# Patient Record
Sex: Female | Born: 1970 | Race: White | Hispanic: No | Marital: Married | Smoking: Light tobacco smoker
Health system: Southern US, Community
[De-identification: ages and names within clinical notes are randomized; demographics above are authoritative.]

## PROBLEM LIST (undated history)

## (undated) DIAGNOSIS — F419 Anxiety disorder, unspecified: Secondary | ICD-10-CM

---

## 2001-11-12 ENCOUNTER — Other Ambulatory Visit: Admission: RE | Admit: 2001-11-12 | Discharge: 2001-11-12 | Payer: Self-pay | Admitting: Obstetrics and Gynecology

## 2003-03-06 ENCOUNTER — Other Ambulatory Visit: Admission: RE | Admit: 2003-03-06 | Discharge: 2003-03-06 | Payer: Self-pay | Admitting: Obstetrics and Gynecology

## 2005-03-15 ENCOUNTER — Ambulatory Visit: Payer: Self-pay | Admitting: Psychiatry

## 2005-03-15 ENCOUNTER — Inpatient Hospital Stay (HOSPITAL_COMMUNITY): Admission: AD | Admit: 2005-03-15 | Discharge: 2005-03-16 | Payer: Self-pay | Admitting: Psychiatry

## 2008-10-19 ENCOUNTER — Other Ambulatory Visit: Admission: RE | Admit: 2008-10-19 | Discharge: 2008-10-19 | Payer: Self-pay | Admitting: Family Medicine

## 2009-01-30 ENCOUNTER — Ambulatory Visit (HOSPITAL_COMMUNITY): Admission: RE | Admit: 2009-01-30 | Discharge: 2009-01-30 | Payer: Self-pay | Admitting: Obstetrics

## 2009-06-08 ENCOUNTER — Inpatient Hospital Stay (HOSPITAL_COMMUNITY): Admission: AD | Admit: 2009-06-08 | Discharge: 2009-06-08 | Payer: Self-pay | Admitting: Obstetrics

## 2009-06-17 ENCOUNTER — Inpatient Hospital Stay (HOSPITAL_COMMUNITY): Admission: AD | Admit: 2009-06-17 | Discharge: 2009-06-20 | Payer: Self-pay | Admitting: Obstetrics & Gynecology

## 2010-04-07 ENCOUNTER — Encounter: Payer: Self-pay | Admitting: Obstetrics

## 2010-06-05 LAB — CBC
MCHC: 34.6 g/dL (ref 30.0–36.0)
Platelets: 176 10*3/uL (ref 150–400)
RBC: 2.92 MIL/uL — ABNORMAL LOW (ref 3.87–5.11)
RDW: 13.4 % (ref 11.5–15.5)
RDW: 13.6 % (ref 11.5–15.5)

## 2010-07-20 ENCOUNTER — Emergency Department (HOSPITAL_COMMUNITY)
Admission: EM | Admit: 2010-07-20 | Discharge: 2010-07-20 | Disposition: A | Discharge status: Home / Self Care | Payer: Self-pay | Attending: Emergency Medicine | Admitting: Emergency Medicine

## 2010-07-20 ENCOUNTER — Emergency Department (HOSPITAL_COMMUNITY): Payer: Self-pay

## 2010-07-20 DIAGNOSIS — Y92009 Unspecified place in unspecified non-institutional (private) residence as the place of occurrence of the external cause: Secondary | ICD-10-CM | POA: Insufficient documentation

## 2010-07-20 DIAGNOSIS — S92309A Fracture of unspecified metatarsal bone(s), unspecified foot, initial encounter for closed fracture: Secondary | ICD-10-CM | POA: Insufficient documentation

## 2010-07-20 DIAGNOSIS — Y998 Other external cause status: Secondary | ICD-10-CM | POA: Insufficient documentation

## 2010-07-20 DIAGNOSIS — W010XXA Fall on same level from slipping, tripping and stumbling without subsequent striking against object, initial encounter: Secondary | ICD-10-CM | POA: Insufficient documentation

## 2010-07-22 ENCOUNTER — Ambulatory Visit (INDEPENDENT_AMBULATORY_CARE_PROVIDER_SITE_OTHER): Payer: Self-pay | Admitting: Orthopedic Surgery

## 2010-07-22 ENCOUNTER — Encounter: Payer: Self-pay | Admitting: Orthopedic Surgery

## 2010-07-22 VITALS — HR 80 | Resp 16 | Ht 66.0 in | Wt 175.0 lb

## 2010-07-22 DIAGNOSIS — S92309A Fracture of unspecified metatarsal bone(s), unspecified foot, initial encounter for closed fracture: Secondary | ICD-10-CM

## 2010-07-22 MED ORDER — HYDROCODONE-ACETAMINOPHEN 7.5-325 MG PO TABS: 1.0000 | ORAL_TABLET | ORAL | Status: AC | PRN

## 2010-07-22 NOTE — Progress Notes (Signed)
Chief complaint pain RIGHT foot  Injury date Friday, April 4  The patient got up from a chair slipped and fell complained of pain in the LEFT foot on the dorsal surface approximately at the mid foot area.  She complains of 7/10 told throbbing intermittent pain which is relieved partially by Percocet nonweightbearing in a posterior splint.  She has some tingling and some swelling and a complaint of throbbing.  Review of systems she has a history of fainting as well as anxiety he does not have any chest pain or shortness of breath.  Chest no major medical problems she does not have any previous surgery listed.  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  She does smoke 4 cigarettes per day does not drink she has a batch this degree does not do any street drugs   General: The patient is normally developed, with normal grooming and hygiene. There are no gross deformities. The body habitus is normal   CDV: The pulse and perfusion of the extremities are normal   LYMPH: There is no gross lymphadenopathy in the extremities   Skin: There are no rashes, ulcers or cafe-au-lait spot   Psyche: The patient is alert, awake and oriented.  Mood is normal   Neuro:  The coordination and balance are normal.  Sensation is normal. Reflexes are 2+ and equal   Musculoskeletal  The patient is ambulatory nonweightbearing with crutches area and her upper extremities are well aligned without contracture subluxation atrophy or tremor  The involved RIGHT foot is tender at the dorsum of the foot there is a mild amount of swelling she can dorsiflex the ankle to neutral the skin is intact ankle joint is stable muscle tone is normal  The x-rays at the hospital show 3 nondisplaced fractures at the base of the second third and fourth metatarsal with no displacement of Lisfranc's joint  Recommend short leg weightbearing  cast  Followup in 3 weeks for x-ray out of plaster and conversion to a short/medium length cam walker from [facility]  Norco 7.5 mg for pain  We gave her a cast shoe to protect the cast

## 2010-07-22 NOTE — Patient Instructions (Signed)
Apply weight as tolerated   Use crutches first 3 weeks

## 2010-08-02 NOTE — H&P (Signed)
[name], [name] NO.:  [id number]   MEDICAL RECORD NO.:  [id number]          PATIENT TYPE:  IPS   LOCATION:  0506                          FACILITY:  [facility]   PHYSICIAN:  [name], M.D. DATE OF BIRTH:  1970-12-17   DATE OF ADMISSION:  03/15/2005  DATE OF DISCHARGE:                         PSYCHIATRIC ADMISSION ASSESSMENT   IDENTIFYING INFORMATION:  This is a 40 year old single white female who was  involuntarily admitted to the [facility] on March 14, 2005.   HISTORY OF PRESENT ILLNESS:  The patient presented to [facility] complaining of a spider bite to her right hand.  Upon  presentation, the patient was extremely intoxicated and admitted to the  hospital staff there that she had drank approximately a pint of vodka plus 3  other lower-alcohol bottled drinks, plus she had done some cocaine.  The  medical doctor considered that the patient was having some substance-induced  psychosis and petitioned to have the patient committed and she subsequently  was transported to the [facility].  The patient denies any  suicidal or homicidal ideation, denies any auditory or visual hallucinations  or any signs or symptoms of mania.   PAST PSYCHIATRIC HISTORY:  She has no previous inpatient or outpatient  following.   SOCIAL HISTORY:  The patient lives with her fiance of approximately 3-1/2  years.  She works as a Patent attorney for 6 years.  She has a  Energy manager degree in human environmental services and an Associates' degree  in liberal arts.  She denies any legal problems and she says that her family  is her social support system.   FAMILY HISTORY:  Not significant.  She knows of no mental illness or  substance abuse in her family.   ALCOHOL AND DRUG HISTORY:  She reports that she normally drinks a glass of  wine while cooking dinner and rarely ever drinks like she did this time  before  presenting to the emergency department.  She rarely does cocaine or  marijuana or other substances.   PAST MEDICAL HISTORY:  Her primary care Amil Bouwman is Dr. Cherly Hensen who is an  OG/GYN.  She has no medical problems.  Her only medication is Depo-Provera  birth control which she takes an IM shot every 3 months.   DRUG ALLERGIES:  She has no known drug allergies.   POSITIVE PHYSICAL FINDINGS:  Her physical examination was performed at [facility].  Here at the [facility], she is a well  appearing, well-nourished, well-developed white female who appears  appropriate for her stated age of [age].  She is 66 inches tall and 144  pounds.  Her vital signs, temperature 98.5, pulse 103, respirations 18,  blood pressure 145/92.   LABORATORY DATA:  Her CBC was significant for mildly elevated hemoglobin of  15.3.  Her comprehensive metabolic panel was significant for a mildly  decreased potassium 3.4, a glucose of 113, and a decreased BUN of 2, as well  as an elevated AST of 39.  Her urine drug screen was positive for cocaine  and marijuana.  Her alcohol level was 209 and her urinalysis was negative.   MENTAL STATUS EXAM:  She was alert and oriented x4 with good eye contact and  an anxious affect.  Her appearance was disheveled and her behavior was  cooperative.  Her speech was clear with even pace and tone.  Her mood was  anxious.  Her thought process was coherent, without suicidal or homicidal  ideation, no evidence of auditory or visual hallucinations or mania was  observed.  Her memory was intact.  Her concentration was normal, her insight  is good and her judgment is good.   ADMISSION DIAGNOSIS:  AXIS I:  Alcohol abuse, cocaine abuse, and marijuana  abuse.  AXIS II:  Deferred.  AXIS III:  Healthy.  AXIS IV:  Mild.  AXIS V:  Current global assessment of functioning of 45, with a past year of  75.   INITIAL PLAN OF CARE:  Initial plan is to admit the patient  involuntarily.  She is placed on a Librium detox protocol.  We will work to stabilize her  mood and increase her coping skills and decrease her stressors.   ESTIMATED LENGTH OF STAY:  2 days.      Yolande Jolly, PA      [name], M.D.  Electronically Signed    AHW/MEDQ  D:  03/16/2005  T:  03/16/2005  Job:  [id number]

## 2010-08-13 ENCOUNTER — Ambulatory Visit (INDEPENDENT_AMBULATORY_CARE_PROVIDER_SITE_OTHER): Payer: Self-pay | Admitting: Orthopedic Surgery

## 2010-08-13 DIAGNOSIS — S92309A Fracture of unspecified metatarsal bone(s), unspecified foot, initial encounter for closed fracture: Secondary | ICD-10-CM

## 2010-08-13 NOTE — Patient Instructions (Signed)
RTW next week

## 2010-08-13 NOTE — Progress Notes (Signed)
X-ray report AP, lateral, oblique, injured foot.  Multiple fractures, 2nd, 3rd, and 4th metatarsals.  Fractures are nondisplaced healing well.  Impression healing fracture 2nd, 3rd, and 4th metatarsals/proximal

## 2010-08-13 NOTE — Progress Notes (Signed)
DOI 07/19/10, xrays [facility] right foot and ankle on 07/20/10, splint and Percocet 5 from er.  The patient got up from a chair slipped and fell complained of pain in the LEFT foot on the dorsal surface approximately at the mid foot area  Diagnosis: The x-rays at the hospital show 3 nondisplaced fractures at the base of the second third and fourth metatarsal with no displacement of Lisfranc's joint  Placed in a short leg walking cast.  Today, here for x-ray, possible convert to short cam walker for fractures.  Xrays show partial healing of the fractures   So we are going to try RTW next week

## 2010-08-29 ENCOUNTER — Ambulatory Visit: Payer: Self-pay | Admitting: Orthopedic Surgery

## 2010-09-05 ENCOUNTER — Ambulatory Visit (INDEPENDENT_AMBULATORY_CARE_PROVIDER_SITE_OTHER): Payer: Self-pay | Admitting: Orthopedic Surgery

## 2010-09-05 DIAGNOSIS — S92309A Fracture of unspecified metatarsal bone(s), unspecified foot, initial encounter for closed fracture: Secondary | ICD-10-CM

## 2010-09-05 NOTE — Progress Notes (Signed)
   Previous history states the following.  DOI 07/19/10, xrays [facility] right foot and ankle on 07/20/10, splint and Percocet 5 from er.  The patient got up from a chair slipped and fell complained of pain in the LEFT foot on the dorsal surface approximately at the mid foot area  Diagnosis: The x-rays at the hospital show 3 nondisplaced fractures at the base of the second third and fourth metatarsal with no displacement of Lisfranc's joint  Placed in a short leg walking cast.  X-rays today.  X-ray show fracture healing of 2nd and 3rd metatarsals and 4th. gradually remove brace come back as needed.  X-ray report multiple views, and LEFT foot  Previous films for comparison  2nd, 3rd, 4th, metatarsal fractures, show healing, and normal alignment.  Impression healed fractures LEFT foot

## 2010-09-05 NOTE — Progress Notes (Signed)
X-ray report multiple views, and LEFT foot  Previous films for comparison  2nd, 3rd, 4th, metatarsal fractures, show healing, and normal alignment.  Impression healed fractures LEFT foot

## 2013-11-05 ENCOUNTER — Emergency Department (HOSPITAL_COMMUNITY)
Admission: EM | Admit: 2013-11-05 | Discharge: 2013-11-05 | Disposition: A | Discharge status: Home / Self Care | Payer: BC Managed Care – PPO | Attending: Emergency Medicine | Admitting: Emergency Medicine

## 2013-11-05 ENCOUNTER — Encounter (HOSPITAL_COMMUNITY): Payer: Self-pay | Admitting: Emergency Medicine

## 2013-11-05 DIAGNOSIS — W172XXA Fall into hole, initial encounter: Secondary | ICD-10-CM | POA: Insufficient documentation

## 2013-11-05 DIAGNOSIS — Z23 Encounter for immunization: Secondary | ICD-10-CM | POA: Diagnosis not present

## 2013-11-05 DIAGNOSIS — Y9301 Activity, walking, marching and hiking: Secondary | ICD-10-CM | POA: Insufficient documentation

## 2013-11-05 DIAGNOSIS — Y92009 Unspecified place in unspecified non-institutional (private) residence as the place of occurrence of the external cause: Secondary | ICD-10-CM | POA: Diagnosis not present

## 2013-11-05 DIAGNOSIS — S81009A Unspecified open wound, unspecified knee, initial encounter: Secondary | ICD-10-CM | POA: Diagnosis not present

## 2013-11-05 DIAGNOSIS — R55 Syncope and collapse: Secondary | ICD-10-CM | POA: Insufficient documentation

## 2013-11-05 DIAGNOSIS — F411 Generalized anxiety disorder: Secondary | ICD-10-CM | POA: Insufficient documentation

## 2013-11-05 DIAGNOSIS — F17200 Nicotine dependence, unspecified, uncomplicated: Secondary | ICD-10-CM | POA: Insufficient documentation

## 2013-11-05 DIAGNOSIS — IMO0002 Reserved for concepts with insufficient information to code with codable children: Secondary | ICD-10-CM

## 2013-11-05 DIAGNOSIS — S81809A Unspecified open wound, unspecified lower leg, initial encounter: Principal | ICD-10-CM

## 2013-11-05 DIAGNOSIS — S91009A Unspecified open wound, unspecified ankle, initial encounter: Principal | ICD-10-CM

## 2013-11-05 HISTORY — DX: Anxiety disorder, unspecified: F41.9

## 2013-11-05 MED ORDER — LIDOCAINE-EPINEPHRINE (PF) 2 %-1:200000 IJ SOLN
10.0000 mL | Freq: Once | INTRAMUSCULAR | Status: AC
  Administered 2013-11-05: 10 mL
  Filled 2013-11-05: qty 10

## 2013-11-05 MED ORDER — TETANUS-DIPHTH-ACELL PERTUSSIS 5-2.5-18.5 LF-MCG/0.5 IM SUSP
0.5000 mL | Freq: Once | INTRAMUSCULAR | Status: AC
  Administered 2013-11-05: 0.5 mL via INTRAMUSCULAR
  Filled 2013-11-05: qty 0.5

## 2013-11-05 MED ORDER — LIDOCAINE HCL (PF) 1 % IJ SOLN
5.0000 mL | Freq: Once | INTRAMUSCULAR | Status: DC
  Filled 2013-11-05: qty 5

## 2013-11-05 NOTE — ED Provider Notes (Signed)
CSN: [id number]     Arrival date & time 11/05/13  1914 History   First MD Initiated Contact with Patient 11/05/13 1951     Chief Complaint  Patient presents with  . Extremity Laceration  . Fall     (Consider location/radiation/quality/duration/timing/severity/associated sxs/prior Treatment) HPI Comments: Pateint was walking in her yard whne she fell in a hold causing a laceration to the anterior portion of her Left knee  This happened at about 4;30 pm  She has applied ice, last tetanus unknown  Patient is a 43 y.o. female presenting with fall. The history is provided by the patient.  Fall This is a new problem. The current episode started today. The problem occurs constantly. The problem has been unchanged. Pertinent negatives include no chills, fever, joint swelling, numbness or weakness. The symptoms are aggravated by walking. She has tried nothing for the symptoms. The treatment provided no relief.    Past Medical History  Diagnosis Date  . Anxiety    History reviewed. No pertinent past surgical history. History reviewed. No pertinent family history. History  Substance Use Topics  . Smoking status: Current Every Day Smoker -- 0.30 packs/day    Types: Cigarettes  . Smokeless tobacco: Not on file  . Alcohol Use: Yes   OB History   Grav Para Term Preterm Abortions TAB SAB Ect Mult Living                 Review of Systems  Constitutional: Negative for fever and chills.  Musculoskeletal: Negative for joint swelling.  Skin: Positive for wound.  Neurological: Negative for dizziness, weakness and numbness.  All other systems reviewed and are negative.     Allergies  Review of patient's allergies indicates no known allergies.  Home Medications   Prior to Admission medications   Medication Sig Start Date End Date Taking? Authorizing Provider  Citalopram Hydrobromide (CELEXA PO) Take by mouth.      Historical Provider, MD  oxyCODONE-acetaminophen (PERCOCET) 5-325 MG per  tablet Take 1 tablet by mouth every 4 (four) hours as needed.      Historical Provider, MD   BP 113/82  Pulse 72  Temp(Src) 98.6 F (37 C) (Oral)  Resp 20  Ht 5\' 6"  (1.676 m)  Wt 155 lb (70.308 kg)  BMI 25.03 kg/m2  SpO2 100% Physical Exam  Nursing note and vitals reviewed. Constitutional: She appears well-developed and well-nourished. No distress.  HENT:  Head: Normocephalic.  Eyes: Pupils are equal, round, and reactive to light.  Neck: Normal range of motion.  Cardiovascular: Normal rate.   Pulmonary/Chest: Effort normal.  Musculoskeletal: Normal range of motion. She exhibits tenderness. She exhibits no edema.       Legs: Neurological: She is alert.  Skin: Skin is warm. No erythema.    ED Course  LACERATION REPAIR Date/Time: 11/05/2013 8:42 PM Performed by: Arman Filter Authorized by: Arman Filter Consent: Verbal consent obtained. written consent not obtained. Risks and benefits: risks, benefits and alternatives were discussed Consent given by: patient Patient understanding: patient states understanding of the procedure being performed Patient identity confirmed: verbally with patient Body area: lower extremity Location details: left knee Laceration length: 3 cm Foreign bodies: no foreign bodies Tendon involvement: none Nerve involvement: none Anesthesia: local infiltration Local anesthetic: lidocaine 2% with epinephrine Anesthetic total: 3 ml Patient sedated: no Preparation: Patient was prepped and draped in the usual sterile fashion. Irrigation solution: saline Irrigation method: syringe Amount of cleaning: standard Debridement: none Degree of undermining:  none Skin closure: 3-0 Prolene Number of sutures: 4 Technique: simple Approximation: loose Approximation difficulty: simple Dressing: 4x4 sterile gauze and antibiotic ointment Patient tolerance: Patient tolerated the procedure well with no immediate complications. Comments: ACE applied over dressing  due to location    (including critical care time) Labs Review Labs Reviewed - No data to display  Imaging Review No results found.   EKG Interpretation None      MDM   Final diagnoses:  Laceration  Vasovagal syncope  Patient had syncopal episode while suturing no loss of contience Immediately appropriate after the event, sipping of fluids Patient has ben observed  PO fluids and food given Vital signs stable Patient ambulated with out difficulty      Arman FilterGail K Antionetta Ator, NP 11/05/13 2138

## 2013-11-05 NOTE — ED Notes (Signed)
Patient states she fell into hole in yard today.  Patient with laceration to left knee, bleeding controlled.  Patient states she did have some ETOH today before the fall.

## 2013-11-05 NOTE — Discharge Instructions (Signed)
Try to drink plenty of water tonight  Be careful changing positions The sutures should be taken out in 14 days

## 2013-11-05 NOTE — ED Notes (Signed)
Patient fainted from sight of needle during suturing. Hannah aware.

## 2013-11-05 NOTE — ED Notes (Signed)
Lidocaine is at the bedside for suturing.

## 2013-11-05 NOTE — ED Notes (Signed)
Gail, NP at the bedside.  

## 2013-11-05 NOTE — ED Notes (Signed)
Called to room by PA, pt had passed out during suturing.  Pt aroused now, stretcher in room, pt placed self on stretcher.  Ginger ale given.

## 2013-11-05 NOTE — ED Provider Notes (Signed)
Medical screening examination/treatment/procedure(s) were performed by non-physician practitioner and as supervising physician I was immediately available for consultation/collaboration.   EKG Interpretation None        Joya Gaskinsonald W Valjean Ruppel, MD 11/05/13 2336

## 2013-11-05 NOTE — ED Notes (Signed)
Patient is alert and oriented, skin pink, comfortable after being transferred to stretcher. Suturing completed and dressing and Ace wrap applied.

## 2014-04-22 ENCOUNTER — Encounter (HOSPITAL_COMMUNITY): Payer: Self-pay | Admitting: *Deleted

## 2014-04-22 ENCOUNTER — Emergency Department (HOSPITAL_COMMUNITY)
Admission: EM | Admit: 2014-04-22 | Discharge: 2014-04-22 | Disposition: A | Discharge status: Home / Self Care | Payer: BLUE CROSS/BLUE SHIELD | Attending: Emergency Medicine | Admitting: Emergency Medicine

## 2014-04-22 DIAGNOSIS — Y9289 Other specified places as the place of occurrence of the external cause: Secondary | ICD-10-CM | POA: Diagnosis not present

## 2014-04-22 DIAGNOSIS — Y998 Other external cause status: Secondary | ICD-10-CM | POA: Insufficient documentation

## 2014-04-22 DIAGNOSIS — W01198A Fall on same level from slipping, tripping and stumbling with subsequent striking against other object, initial encounter: Secondary | ICD-10-CM | POA: Insufficient documentation

## 2014-04-22 DIAGNOSIS — F419 Anxiety disorder, unspecified: Secondary | ICD-10-CM | POA: Diagnosis not present

## 2014-04-22 DIAGNOSIS — S0083XA Contusion of other part of head, initial encounter: Secondary | ICD-10-CM | POA: Diagnosis not present

## 2014-04-22 DIAGNOSIS — Z720 Tobacco use: Secondary | ICD-10-CM | POA: Diagnosis not present

## 2014-04-22 DIAGNOSIS — S0181XA Laceration without foreign body of other part of head, initial encounter: Secondary | ICD-10-CM

## 2014-04-22 DIAGNOSIS — Y9389 Activity, other specified: Secondary | ICD-10-CM | POA: Diagnosis not present

## 2014-04-22 DIAGNOSIS — Z79899 Other long term (current) drug therapy: Secondary | ICD-10-CM | POA: Diagnosis not present

## 2014-04-22 MED ORDER — LIDOCAINE-EPINEPHRINE-TETRACAINE (LET) SOLUTION
3.0000 mL | Freq: Once | NASAL | Status: AC
  Administered 2014-04-22: 3 mL via TOPICAL
  Filled 2014-04-22: qty 3

## 2014-04-22 NOTE — Discharge Instructions (Signed)
Return in 4 or 5 days for suture removal. Return sooner for any problems.

## 2014-04-22 NOTE — ED Provider Notes (Signed)
CSN: [id number]     Arrival date & time 04/22/14  2101 History   This chart was scribe for Flint Melter, MD by Angelene Giovanni, ED Scribe. The patient was seen in room [facility]10/[facility] and the patient's care was started at 9:23 PM.    Chief Complaint  Patient presents with  . Head Laceration   The history is provided by the patient. No language interpreter was used.   HPI Comments: [name] is a 44 y.o. female who presents to the Emergency Department complaining of a right forehead laceration after a fall that occurred earlier today. She reports that he was had a glass of wine today and she slipped on the floor after cleaning it earlier in the day. She denies any N/V/D and LOC. She reports that she takes anxiety medication. She also reports that she had a tetanus shot last year.   Past Medical History  Diagnosis Date  . Anxiety    History reviewed. No pertinent past surgical history. History reviewed. No pertinent family history. History  Substance Use Topics  . Smoking status: Current Every Day Smoker -- 0.30 packs/day    Types: Cigarettes  . Smokeless tobacco: Not on file  . Alcohol Use: Yes   OB History    No data available     Review of Systems  Constitutional: Negative for fever.  Respiratory: Negative for shortness of breath.   Cardiovascular: Negative for chest pain.  Gastrointestinal: Negative for nausea, vomiting and diarrhea.  Musculoskeletal: Negative for back pain.  Skin: Positive for wound.  Neurological: Negative for headaches.  Psychiatric/Behavioral: The patient is nervous/anxious.       Allergies  Review of patient's allergies indicates no known allergies.  Home Medications   Prior to Admission medications   Medication Sig Start Date End Date Taking? Authorizing Provider  Citalopram Hydrobromide (CELEXA PO) Take by mouth.      Historical Provider, MD  oxyCODONE-acetaminophen (PERCOCET) 5-325 MG per tablet Take 1 tablet by mouth every 4 (four)  hours as needed.      Historical Provider, MD   BP 142/116 mmHg  Pulse 100  Temp(Src) 97.9 F (36.6 C) (Oral)  Resp 20  Ht  (1.676 m)  Wt 160 lb (72.576 kg)  BMI 25.84 kg/m2  SpO2 97% Physical Exam  Constitutional: She is oriented to person, place, and time. She appears well-developed and well-nourished.  HENT:  Head: Normocephalic and atraumatic.  Eyes: Conjunctivae and EOM are normal. Pupils are equal, round, and reactive to light.  Neck: Normal range of motion and phonation normal. Neck supple.  Cardiovascular: Normal rate and regular rhythm.   Pulmonary/Chest: Effort normal and breath sounds normal. She exhibits no tenderness.  Abdominal: Soft. She exhibits no distension. There is no tenderness. There is no guarding.  Musculoskeletal: Normal range of motion.  Neurological: She is alert and oriented to person, place, and time. She exhibits normal muscle tone.  Skin: Skin is warm and dry.  Superficial gaping lac on right forehead  Psychiatric: Her behavior is normal. Judgment and thought content normal.  Tearful and anxious  Nursing note and vitals reviewed.   ED Course  Procedures (including critical care time) DIAGNOSTIC STUDIES: Oxygen Saturation is 97% on RA, adequate by my interpretation.    COORDINATION OF CARE: Medications - No data to display  Patient Vitals for the past 24 hrs:  BP Temp Temp src Pulse Resp SpO2 Height Weight  04/22/14 2107 (!) 142/116 mmHg 97.9 F (36.6 C) Oral 100  20 97 % 5\' 6"  (1.676 m) 160 lb (72.576 kg)   9:29 PM- Pt advised of plan for treatment and pt agrees.    Labs Review Labs Reviewed - No data to display  Imaging Review No results found.   EKG Interpretation None      MDM   Final diagnoses:  Laceration of forehead without complication, initial encounter  Contusion of forehead, initial encounter    Mechanical fall, without serious injury.  Facial laceration without fracture.  Nursing Notes Reviewed/ Care  Coordinated Applicable Imaging Reviewed Interpretation of Laboratory Data incorporated into ED treatment  The patient appears reasonably screened and/or stabilized for discharge and I doubt any other medical condition or other [facility] requiring further screening, evaluation, or treatment in the ED at this time prior to discharge.  Plan: Home Medications- OTC analgesia; Home Treatments- rest; return here if the recommended treatment, does not improve the symptoms; Recommended follow up- PCP prn    I personally performed the services described in this documentation, which was scribed in my presence. The recorded information has been reviewed and is accurate.     Flint MelterElliott L Debara Kamphuis, MD 04/22/14 [phone]

## 2014-04-22 NOTE — ED Notes (Signed)
Patient states she cleaned her floors earlier today, indulged in "several glasses of wine" and "hit a slick spot and fell", states her husband was there, denies LOC. Was driven in by neighbor.

## 2014-04-22 NOTE — ED Provider Notes (Signed)
THIS IS A SHARED VISIT WITH DR. Effie ShyWENTZ [name] is a 44 y.o. female who presents to the ED with a laceration to the right forehead.  LACERATION REPAIR Performed by: NEESE,HOPE Authorized by: NEESE,HOPE Consent: Verbal consent obtained. Risks and benefits: risks, benefits and alternatives were discussed Consent given by: patient Patient identity confirmed: provided demographic data Prepped and Draped in normal sterile fashion Wound explored  Laceration Location: right forehead  Laceration Length: 2.5 cm  No Foreign Bodies seen or palpated  Anesthesia:LET  Irrigation method: syringe Amount of cleaning: standard  Skin closure: 6-0 prolene  Number of sutures: 6  Technique: interrupted  Patient tolerance: Patient tolerated the procedure well with no immediate complications.   [name], NP 04/22/14 2252  Flint MelterElliott L Wentz, MD 04/22/14 [id number]

## 2014-04-22 NOTE — ED Notes (Signed)
Pt states she cleaned her hardwood floors earlier today. Pt states she slipped on the floor tonight. Pt unsure what she hit? States it happened so fast. Denies loc, dizziness, or vision problems.

## 2015-09-24 ENCOUNTER — Ambulatory Visit: Payer: Self-pay | Admitting: Obstetrics

## 2015-10-02 ENCOUNTER — Ambulatory Visit: Payer: Self-pay | Admitting: Obstetrics

## 2015-10-17 ENCOUNTER — Encounter: Payer: Self-pay | Admitting: Obstetrics

## 2015-10-17 ENCOUNTER — Ambulatory Visit (INDEPENDENT_AMBULATORY_CARE_PROVIDER_SITE_OTHER): Payer: BLUE CROSS/BLUE SHIELD | Admitting: Obstetrics

## 2015-10-17 VITALS — BP 146/90 | HR 73 | Temp 97.2°F | Wt 164.0 lb

## 2015-10-17 DIAGNOSIS — Z01419 Encounter for gynecological examination (general) (routine) without abnormal findings: Secondary | ICD-10-CM | POA: Diagnosis not present

## 2015-10-17 DIAGNOSIS — Z3009 Encounter for other general counseling and advice on contraception: Secondary | ICD-10-CM | POA: Diagnosis not present

## 2015-10-17 DIAGNOSIS — Z124 Encounter for screening for malignant neoplasm of cervix: Secondary | ICD-10-CM | POA: Diagnosis not present

## 2015-10-17 DIAGNOSIS — Z1239 Encounter for other screening for malignant neoplasm of breast: Secondary | ICD-10-CM

## 2015-10-17 NOTE — Addendum Note (Signed)
Addended by: Elby Beck F on: 10/17/2015 05:05 PM   Modules accepted: Orders

## 2015-10-17 NOTE — Progress Notes (Signed)
Patient ID: [name], female   DOB: 08-14-1970, 45 y.o.   MRN: [id number]   Subjective:        [name] is a 45 y.o. female here for a routine exam.  Current complaints: None.    Personal health questionnaire:  Is patient [name], have a family history of breast and/or ovarian cancer: no Is there a family history of uterine cancer diagnosed at age < 69, gastrointestinal cancer, urinary tract cancer, family member who is a Personnel officer syndrome-associated carrier: no Is the patient overweight and hypertensive, family history of diabetes, personal history of gestational diabetes, preeclampsia or PCOS: no Is patient over 23, have PCOS,  family history of premature CHD under age 13, diabetes, smoke, have hypertension or peripheral artery disease:  no At any time, has a partner hit, kicked or otherwise hurt or frightened you?: no Over the past 2 weeks, have you felt down, depressed or hopeless?: no Over the past 2 weeks, have you felt little interest or pleasure in doing things?:no   Gynecologic History No LMP recorded. Patient is not currently having periods (Reason: IUD). Contraception: IUD Last Pap: ~ 6 years ago. Results were: normal Last mammogram: none. Results were: none  Obstetric History OB History  No data available    Past Medical History:  Diagnosis Date  . Anxiety     History reviewed. No pertinent surgical history.   Current Outpatient Prescriptions:  .  clonazePAM (KLONOPIN) 0.5 MG tablet, Take 0.25-0.5 mg by mouth 2 (two) times daily as needed for anxiety. , Disp: , Rfl: 1 .  levonorgestrel (MIRENA) 20 MCG/24HR IUD, 1 each by Intrauterine route once., Disp: , Rfl:  .  escitalopram (LEXAPRO) 10 MG tablet, Take 10 mg by mouth daily., Disp: , Rfl: 6 .  Multiple Vitamin (MULTIVITAMIN WITH MINERALS) TABS tablet, Take 1 tablet by mouth daily., Disp: , Rfl:  No Known Allergies  Social History  Substance Use Topics  . Smoking status: Light Tobacco  Smoker    Types: Cigarettes  . Smokeless tobacco: Never Used  . Alcohol use 1.2 oz/week    2 Glasses of wine per week     Comment: occasional    History reviewed. No pertinent family history.    Review of Systems  Constitutional: negative for fatigue and weight loss Respiratory: negative for cough and wheezing Cardiovascular: negative for chest pain, fatigue and palpitations Gastrointestinal: negative for abdominal pain and change in bowel habits Musculoskeletal:negative for myalgias Neurological: negative for gait problems and tremors Behavioral/Psych: negative for abusive relationship, depression Endocrine: negative for temperature intolerance   Genitourinary:negative for abnormal menstrual periods, genital lesions, hot flashes, sexual problems and vaginal discharge Integument/breast: negative for breast lump, breast tenderness, nipple discharge and skin lesion(s)    Objective:       BP (!) 146/90 Comment: 135/98  Pulse 73   Temp 97.2 F (36.2 C)   Wt 164 lb (74.4 kg)   BMI 26.47 kg/m  General:   alert  Skin:   no rash or abnormalities  Lungs:   clear to auscultation bilaterally  Heart:   regular rate and rhythm, S1, S2 normal, no murmur, click, rub or gallop  Breasts:   normal without suspicious masses, skin or nipple changes or axillary nodes  Abdomen:  normal findings: no organomegaly, soft, non-tender and no hernia  Pelvis:  External genitalia: normal general appearance Urinary system: urethral meatus normal and bladder without fullness, nontender Vaginal: normal without tenderness, induration or masses Cervix: normal appearance Adnexa:  normal bimanual exam Uterus: anteverted and non-tender, normal size   Lab Review Urine pregnancy test Labs reviewed yes Radiologic studies reviewed no   Assessment:    Healthy female exam.    Elevated BP  Contraceptive counseling and advice.  Wants to continue with Mirena IUD.   Plan:    Education reviewed: calcium  supplements, low fat, low cholesterol diet, self breast exams, smoking cessation and weight bearing exercise. Contraception: IUD. Mammogram ordered. Follow up in: 2 weeks.  IUD removal and reinsertion  No orders of the defined types were placed in this encounter.  No orders of the defined types were placed in this encounter.

## 2015-10-19 LAB — PAP IG AND HPV HIGH-RISK
HPV, high-risk: NEGATIVE
PAP Smear Comment: 0

## 2015-10-20 ENCOUNTER — Other Ambulatory Visit: Payer: Self-pay | Admitting: Obstetrics

## 2015-10-20 DIAGNOSIS — N760 Acute vaginitis: Principal | ICD-10-CM

## 2015-10-20 DIAGNOSIS — B373 Candidiasis of vulva and vagina: Secondary | ICD-10-CM

## 2015-10-20 DIAGNOSIS — B3731 Acute candidiasis of vulva and vagina: Secondary | ICD-10-CM

## 2015-10-20 DIAGNOSIS — B9689 Other specified bacterial agents as the cause of diseases classified elsewhere: Secondary | ICD-10-CM

## 2015-10-20 LAB — NUSWAB VG, CANDIDA 6SP
Atopobium vaginae: HIGH Score — AB
CANDIDA ALBICANS, NAA: POSITIVE — AB
CANDIDA PARAPSILOSIS, NAA: NEGATIVE
CANDIDA TROPICALIS, NAA: NEGATIVE
Candida glabrata, NAA: NEGATIVE
Candida krusei, NAA: NEGATIVE
Candida lusitaniae, NAA: NEGATIVE
Trich vag by NAA: NEGATIVE

## 2015-10-20 MED ORDER — METRONIDAZOLE 500 MG PO TABS: 500.0000 mg | ORAL_TABLET | Freq: Two times a day (BID) | ORAL | 2 refills | Status: DC

## 2015-10-20 MED ORDER — FLUCONAZOLE 150 MG PO TABS: 150.0000 mg | ORAL_TABLET | Freq: Once | ORAL | 2 refills | Status: AC

## 2015-10-23 ENCOUNTER — Telehealth: Payer: Self-pay

## 2015-10-23 NOTE — Telephone Encounter (Signed)
-----   Message from Brock Badharles A Harper, MD sent at 10/20/2015  7:59 AM EDT ----- Flagyl Rx Diflucan Rx

## 2015-10-23 NOTE — Telephone Encounter (Signed)
Patient called and made aware of bacterial vaginosis and yeast infection. Patient made aware that flagyl and diflucan medications sent to her pharmacy. Patient made aware to avoid alcohol while taking the flagyl. Patient states understanding. Armandina StammerJennifer Jancie Kercher RNBSN

## 2015-10-24 ENCOUNTER — Ambulatory Visit: Payer: BLUE CROSS/BLUE SHIELD

## 2015-10-30 ENCOUNTER — Ambulatory Visit
Admission: RE | Admit: 2015-10-30 | Discharge: 2015-10-30 | Disposition: A | Discharge status: Home / Self Care | Payer: BLUE CROSS/BLUE SHIELD | Source: Ambulatory Visit | Attending: Obstetrics | Admitting: Obstetrics

## 2015-10-30 ENCOUNTER — Ambulatory Visit: Payer: BLUE CROSS/BLUE SHIELD

## 2015-10-30 DIAGNOSIS — Z1231 Encounter for screening mammogram for malignant neoplasm of breast: Secondary | ICD-10-CM | POA: Diagnosis not present

## 2015-10-30 DIAGNOSIS — Z1239 Encounter for other screening for malignant neoplasm of breast: Secondary | ICD-10-CM

## 2015-10-31 ENCOUNTER — Other Ambulatory Visit: Payer: Self-pay | Admitting: Obstetrics

## 2015-10-31 ENCOUNTER — Ambulatory Visit: Payer: BLUE CROSS/BLUE SHIELD | Admitting: Obstetrics

## 2015-10-31 DIAGNOSIS — R928 Other abnormal and inconclusive findings on diagnostic imaging of breast: Secondary | ICD-10-CM

## 2015-11-02 DIAGNOSIS — E782 Mixed hyperlipidemia: Secondary | ICD-10-CM | POA: Diagnosis not present

## 2015-11-05 ENCOUNTER — Ambulatory Visit
Admission: RE | Admit: 2015-11-05 | Discharge: 2015-11-05 | Disposition: A | Discharge status: Home / Self Care | Payer: BLUE CROSS/BLUE SHIELD | Source: Ambulatory Visit | Attending: Obstetrics | Admitting: Obstetrics

## 2015-11-05 DIAGNOSIS — R928 Other abnormal and inconclusive findings on diagnostic imaging of breast: Secondary | ICD-10-CM

## 2015-11-05 DIAGNOSIS — R922 Inconclusive mammogram: Secondary | ICD-10-CM | POA: Diagnosis not present

## 2015-11-05 DIAGNOSIS — N6489 Other specified disorders of breast: Secondary | ICD-10-CM | POA: Diagnosis not present

## 2015-11-11 DIAGNOSIS — S0101XA Laceration without foreign body of scalp, initial encounter: Secondary | ICD-10-CM | POA: Diagnosis not present

## 2015-11-11 DIAGNOSIS — S0180XA Unspecified open wound of other part of head, initial encounter: Secondary | ICD-10-CM | POA: Diagnosis not present

## 2015-11-11 DIAGNOSIS — S0990XA Unspecified injury of head, initial encounter: Secondary | ICD-10-CM | POA: Diagnosis not present

## 2015-11-11 DIAGNOSIS — F419 Anxiety disorder, unspecified: Secondary | ICD-10-CM | POA: Diagnosis not present

## 2015-11-11 DIAGNOSIS — R51 Headache: Secondary | ICD-10-CM | POA: Diagnosis not present

## 2015-11-11 DIAGNOSIS — R4583 Excessive crying of child, adolescent or adult: Secondary | ICD-10-CM | POA: Diagnosis not present

## 2015-11-14 ENCOUNTER — Ambulatory Visit (INDEPENDENT_AMBULATORY_CARE_PROVIDER_SITE_OTHER): Payer: BLUE CROSS/BLUE SHIELD | Admitting: Obstetrics

## 2015-11-14 ENCOUNTER — Encounter: Payer: Self-pay | Admitting: Obstetrics

## 2015-11-14 ENCOUNTER — Encounter: Payer: Self-pay | Admitting: *Deleted

## 2015-11-14 DIAGNOSIS — Z30430 Encounter for insertion of intrauterine contraceptive device: Secondary | ICD-10-CM

## 2015-11-14 DIAGNOSIS — Z30014 Encounter for initial prescription of intrauterine contraceptive device: Secondary | ICD-10-CM | POA: Diagnosis not present

## 2015-11-14 DIAGNOSIS — Z0000 Encounter for general adult medical examination without abnormal findings: Secondary | ICD-10-CM

## 2015-11-14 DIAGNOSIS — G8918 Other acute postprocedural pain: Secondary | ICD-10-CM

## 2015-11-14 DIAGNOSIS — Z30433 Encounter for removal and reinsertion of intrauterine contraceptive device: Secondary | ICD-10-CM | POA: Diagnosis not present

## 2015-11-14 MED ORDER — PNV PRENATAL PLUS MULTIVITAMIN 27-1 MG PO TABS: 1.0000 | ORAL_TABLET | Freq: Every day | ORAL | 11 refills | Status: DC

## 2015-11-14 MED ORDER — HYDROCODONE-IBUPROFEN 5-200 MG PO TABS: 1.0000 | ORAL_TABLET | Freq: Four times a day (QID) | ORAL | 0 refills | Status: DC | PRN

## 2015-11-14 NOTE — Progress Notes (Signed)
Subjective:    [name] is a 45 y.o. female who presents for IUD Removal and Reinsertion. The patient has no complaints today. The patient is sexually active. Pertinent past medical history: current smoker.  The information documented in the HPI was reviewed and verified.  Menstrual History: OB History    No data available       No LMP recorded. Patient is not currently having periods (Reason: IUD).   Patient Active Problem List   Diagnosis Date Noted  . Metatarsal bone fracture 08/13/2010   Past Medical History:  Diagnosis Date  . Anxiety     No past surgical history on file.   Current Outpatient Prescriptions:  .  clonazePAM (KLONOPIN) 0.5 MG tablet, Take 0.25-0.5 mg by mouth 2 (two) times daily as needed for anxiety. , Disp: , Rfl: 1 .  levonorgestrel (MIRENA) 20 MCG/24HR IUD, 1 each by Intrauterine route once., Disp: , Rfl:  .  Multiple Vitamin (MULTIVITAMIN WITH MINERALS) TABS tablet, Take 1 tablet by mouth daily., Disp: , Rfl:  No Known Allergies  Social History  Substance Use Topics  . Smoking status: Light Tobacco Smoker    Types: Cigarettes  . Smokeless tobacco: Never Used  . Alcohol use 1.2 oz/week    2 Glasses of wine per week     Comment: occasional    No family history on file.     Review of Systems Constitutional: negative for weight loss Genitourinary:negative for abnormal menstrual periods and vaginal discharge   Objective:   There were no vitals taken for this visit.            General:  Alert and no distress Abdomen:  normal findings: no organomegaly, soft, non-tender and no hernia  Pelvis:  External genitalia: normal general appearance Urinary system: urethral meatus normal and bladder without fullness, nontender Vaginal: normal without tenderness, induration or masses Cervix: normal appearance.  IUD string visible. Adnexa: normal bimanual exam Uterus: anteverted and non-tender, normal size   IUD Removal Procedure Note:  Cervix prepped with Betadine x 3 and grasped with a single toothed tenaculum.  IUD removed with long dressing forceps, intact. Coral Ceo MD   IUD Insertion Procedure Note  Pre-operative Diagnosis: Desire Sterilization  Post-operative Diagnosis: same  Indications: contraception  Procedure Details  Urine pregnancy test was not done.  The risks (including infection, bleeding, pain, and uterine perforation) and benefits of the procedure were explained to the patient and Written informed consent was obtained.    Cervix cleansed with Betadine. Uterus sounded to 7 cm. IUD inserted without difficulty. String visible and trimmed. Patient tolerated procedure well.  IUD Information: Mirena, Lot # [id number], Expiration date 52 / 20.  Condition: Stable  Complications: None  Plan:  The patient was advised to call for any fever or for prolonged or severe pain or bleeding. She was advised to use Vicoprofen (prescribed) as needed for mild to moderate pain.   Attending Physician Documentation: I was present for or participated in the entire procedure, including opening and closing.   Lab Review Urine pregnancy test Labs reviewed yes Radiologic studies reviewed no  <50% of 25 min visit spent on counseling and coordination of care.   Assessment:    45 y.o., continuing IUD, no contraindications.   Plan:    All questions answered. Discussed healthy lifestyle modifications. Follow up in 6 weeks.  No orders of the defined types were placed in this encounter.  No orders of the defined types were placed in this encounter.  Coral Ceoharles Seeley Hissong MD

## 2015-11-16 DIAGNOSIS — R945 Abnormal results of liver function studies: Secondary | ICD-10-CM | POA: Diagnosis not present

## 2015-11-16 DIAGNOSIS — N62 Hypertrophy of breast: Secondary | ICD-10-CM | POA: Diagnosis not present

## 2015-11-16 DIAGNOSIS — Z720 Tobacco use: Secondary | ICD-10-CM | POA: Diagnosis not present

## 2015-11-16 DIAGNOSIS — G4733 Obstructive sleep apnea (adult) (pediatric): Secondary | ICD-10-CM | POA: Diagnosis not present

## 2015-11-20 DIAGNOSIS — Z4802 Encounter for removal of sutures: Secondary | ICD-10-CM | POA: Diagnosis not present

## 2015-12-26 ENCOUNTER — Ambulatory Visit: Payer: Self-pay | Admitting: Obstetrics

## 2016-01-23 DIAGNOSIS — G4733 Obstructive sleep apnea (adult) (pediatric): Secondary | ICD-10-CM | POA: Diagnosis not present

## 2016-02-22 DIAGNOSIS — G4733 Obstructive sleep apnea (adult) (pediatric): Secondary | ICD-10-CM | POA: Diagnosis not present

## 2016-03-24 DIAGNOSIS — G4733 Obstructive sleep apnea (adult) (pediatric): Secondary | ICD-10-CM | POA: Diagnosis not present

## 2016-04-21 DIAGNOSIS — J060 Acute laryngopharyngitis: Secondary | ICD-10-CM | POA: Diagnosis not present

## 2016-04-21 DIAGNOSIS — R05 Cough: Secondary | ICD-10-CM | POA: Diagnosis not present

## 2016-12-25 DIAGNOSIS — Z0000 Encounter for general adult medical examination without abnormal findings: Secondary | ICD-10-CM | POA: Diagnosis not present

## 2016-12-27 DIAGNOSIS — Z79899 Other long term (current) drug therapy: Secondary | ICD-10-CM | POA: Diagnosis not present

## 2016-12-27 DIAGNOSIS — Z6826 Body mass index (BMI) 26.0-26.9, adult: Secondary | ICD-10-CM | POA: Diagnosis not present

## 2016-12-27 DIAGNOSIS — Z0000 Encounter for general adult medical examination without abnormal findings: Secondary | ICD-10-CM | POA: Diagnosis not present

## 2017-01-01 DIAGNOSIS — R05 Cough: Secondary | ICD-10-CM | POA: Diagnosis not present

## 2017-02-20 DIAGNOSIS — R070 Pain in throat: Secondary | ICD-10-CM | POA: Diagnosis not present

## 2017-04-20 DIAGNOSIS — F339 Major depressive disorder, recurrent, unspecified: Secondary | ICD-10-CM | POA: Diagnosis not present

## 2017-05-08 DIAGNOSIS — J019 Acute sinusitis, unspecified: Secondary | ICD-10-CM | POA: Diagnosis not present

## 2017-05-25 DIAGNOSIS — F339 Major depressive disorder, recurrent, unspecified: Secondary | ICD-10-CM | POA: Diagnosis not present

## 2017-05-25 DIAGNOSIS — F419 Anxiety disorder, unspecified: Secondary | ICD-10-CM | POA: Diagnosis not present

## 2017-05-25 DIAGNOSIS — Z6828 Body mass index (BMI) 28.0-28.9, adult: Secondary | ICD-10-CM | POA: Diagnosis not present

## 2017-12-03 DIAGNOSIS — K219 Gastro-esophageal reflux disease without esophagitis: Secondary | ICD-10-CM | POA: Diagnosis not present

## 2017-12-03 DIAGNOSIS — Z720 Tobacco use: Secondary | ICD-10-CM | POA: Diagnosis not present

## 2017-12-03 DIAGNOSIS — F419 Anxiety disorder, unspecified: Secondary | ICD-10-CM | POA: Diagnosis not present

## 2017-12-03 DIAGNOSIS — Z6828 Body mass index (BMI) 28.0-28.9, adult: Secondary | ICD-10-CM | POA: Diagnosis not present

## 2017-12-03 DIAGNOSIS — R945 Abnormal results of liver function studies: Secondary | ICD-10-CM | POA: Diagnosis not present

## 2017-12-17 DIAGNOSIS — Z6828 Body mass index (BMI) 28.0-28.9, adult: Secondary | ICD-10-CM | POA: Diagnosis not present

## 2017-12-17 DIAGNOSIS — F419 Anxiety disorder, unspecified: Secondary | ICD-10-CM | POA: Diagnosis not present

## 2017-12-17 DIAGNOSIS — Z0000 Encounter for general adult medical examination without abnormal findings: Secondary | ICD-10-CM | POA: Diagnosis not present

## 2017-12-17 DIAGNOSIS — F39 Unspecified mood [affective] disorder: Secondary | ICD-10-CM | POA: Diagnosis not present

## 2018-06-17 DIAGNOSIS — F339 Major depressive disorder, recurrent, unspecified: Secondary | ICD-10-CM | POA: Diagnosis not present

## 2018-06-17 DIAGNOSIS — F419 Anxiety disorder, unspecified: Secondary | ICD-10-CM | POA: Diagnosis not present

## 2018-10-19 DIAGNOSIS — Z6828 Body mass index (BMI) 28.0-28.9, adult: Secondary | ICD-10-CM | POA: Diagnosis not present

## 2018-10-19 DIAGNOSIS — F5101 Primary insomnia: Secondary | ICD-10-CM | POA: Diagnosis not present

## 2018-10-19 DIAGNOSIS — F419 Anxiety disorder, unspecified: Secondary | ICD-10-CM | POA: Diagnosis not present

## 2018-10-19 DIAGNOSIS — F339 Major depressive disorder, recurrent, unspecified: Secondary | ICD-10-CM | POA: Diagnosis not present

## 2018-10-19 DIAGNOSIS — R61 Generalized hyperhidrosis: Secondary | ICD-10-CM | POA: Diagnosis not present

## 2018-10-19 DIAGNOSIS — R42 Dizziness and giddiness: Secondary | ICD-10-CM | POA: Diagnosis not present

## 2018-10-19 DIAGNOSIS — F39 Unspecified mood [affective] disorder: Secondary | ICD-10-CM | POA: Diagnosis not present

## 2018-10-19 DIAGNOSIS — F411 Generalized anxiety disorder: Secondary | ICD-10-CM | POA: Diagnosis not present

## 2018-10-19 DIAGNOSIS — R070 Pain in throat: Secondary | ICD-10-CM | POA: Diagnosis not present

## 2018-10-19 DIAGNOSIS — Z0000 Encounter for general adult medical examination without abnormal findings: Secondary | ICD-10-CM | POA: Diagnosis not present

## 2018-12-24 DIAGNOSIS — F39 Unspecified mood [affective] disorder: Secondary | ICD-10-CM | POA: Diagnosis not present

## 2018-12-24 DIAGNOSIS — R945 Abnormal results of liver function studies: Secondary | ICD-10-CM | POA: Diagnosis not present

## 2018-12-24 DIAGNOSIS — Z720 Tobacco use: Secondary | ICD-10-CM | POA: Diagnosis not present

## 2018-12-24 DIAGNOSIS — F411 Generalized anxiety disorder: Secondary | ICD-10-CM | POA: Diagnosis not present

## 2018-12-24 DIAGNOSIS — Z6828 Body mass index (BMI) 28.0-28.9, adult: Secondary | ICD-10-CM | POA: Diagnosis not present

## 2018-12-24 DIAGNOSIS — Z0000 Encounter for general adult medical examination without abnormal findings: Secondary | ICD-10-CM | POA: Diagnosis not present

## 2018-12-28 DIAGNOSIS — R05 Cough: Secondary | ICD-10-CM | POA: Diagnosis not present

## 2018-12-28 DIAGNOSIS — Z0001 Encounter for general adult medical examination with abnormal findings: Secondary | ICD-10-CM | POA: Diagnosis not present

## 2018-12-28 DIAGNOSIS — F411 Generalized anxiety disorder: Secondary | ICD-10-CM | POA: Diagnosis not present

## 2018-12-28 DIAGNOSIS — F39 Unspecified mood [affective] disorder: Secondary | ICD-10-CM | POA: Diagnosis not present

## 2018-12-28 DIAGNOSIS — Z23 Encounter for immunization: Secondary | ICD-10-CM | POA: Diagnosis not present

## 2018-12-28 DIAGNOSIS — Z6828 Body mass index (BMI) 28.0-28.9, adult: Secondary | ICD-10-CM | POA: Diagnosis not present

## 2018-12-28 DIAGNOSIS — Z0000 Encounter for general adult medical examination without abnormal findings: Secondary | ICD-10-CM | POA: Diagnosis not present

## 2019-02-22 DIAGNOSIS — R070 Pain in throat: Secondary | ICD-10-CM | POA: Diagnosis not present

## 2019-02-22 DIAGNOSIS — R0981 Nasal congestion: Secondary | ICD-10-CM | POA: Diagnosis not present

## 2019-05-15 ENCOUNTER — Ambulatory Visit: Payer: BC Managed Care – PPO | Attending: Internal Medicine

## 2019-05-15 DIAGNOSIS — Z23 Encounter for immunization: Secondary | ICD-10-CM | POA: Insufficient documentation

## 2019-05-15 NOTE — Progress Notes (Signed)
   Covid-19 Vaccination Clinic  Name:  [name]    MRN: [id number] DOB: [birth year]  05/15/2019  [name] was observed post Covid-19 immunization for 15 minutes without incidence. She was provided with Vaccine Information Sheet and instruction to access the V-Safe system.   [name] was instructed to call 911 with any severe reactions post vaccine: Marland Kitchen Difficulty breathing  . Swelling of your face and throat  . A fast heartbeat  . A bad rash all over your body  . Dizziness and weakness    Immunizations Administered    Name Date Dose VIS Date Route   Moderna COVID-19 Vaccine 05/15/2019  5:10 PM 0.5 mL 02/15/2019 Intramuscular   Manufacturer: Moderna   Lot: [id number]   NDC: 03128-118-86

## 2019-06-18 ENCOUNTER — Ambulatory Visit: Payer: BC Managed Care – PPO | Attending: Internal Medicine

## 2019-06-18 DIAGNOSIS — Z23 Encounter for immunization: Secondary | ICD-10-CM

## 2019-06-18 NOTE — Progress Notes (Signed)
   Covid-19 Vaccination Clinic  Name:  Haylo Fake    MRN: [id number] DOB: 06-29-1970  06/18/2019  Ms. Harbach was observed post Covid-19 immunization for 15 minutes without incident. She was provided with Vaccine Information Sheet and instruction to access the V-Safe system.   Ms. Silverio was instructed to call 911 with any severe reactions post vaccine: Marland Kitchen Difficulty breathing  . Swelling of face and throat  . A fast heartbeat  . A bad rash all over body  . Dizziness and weakness   Immunizations Administered    Name Date Dose VIS Date Route   Moderna COVID-19 Vaccine 06/18/2019 11:35 AM 0.5 mL 02/15/2019 Intramuscular   Manufacturer: Moderna   Lot: [id number]   NDC: 82993-716-96

## 2019-09-12 ENCOUNTER — Other Ambulatory Visit: Payer: Self-pay | Admitting: Internal Medicine

## 2019-09-12 ENCOUNTER — Emergency Department (HOSPITAL_COMMUNITY)
Admission: EM | Admit: 2019-09-12 | Discharge: 2019-09-12 | Disposition: A | Discharge status: Home / Self Care | Payer: BC Managed Care – PPO | Attending: Emergency Medicine | Admitting: Emergency Medicine

## 2019-09-12 ENCOUNTER — Emergency Department (HOSPITAL_COMMUNITY): Payer: BC Managed Care – PPO

## 2019-09-12 ENCOUNTER — Other Ambulatory Visit (HOSPITAL_COMMUNITY): Payer: Self-pay | Admitting: Internal Medicine

## 2019-09-12 ENCOUNTER — Encounter (HOSPITAL_COMMUNITY): Payer: Self-pay

## 2019-09-12 ENCOUNTER — Other Ambulatory Visit: Payer: Self-pay

## 2019-09-12 DIAGNOSIS — R102 Pelvic and perineal pain: Secondary | ICD-10-CM | POA: Diagnosis not present

## 2019-09-12 DIAGNOSIS — R109 Unspecified abdominal pain: Secondary | ICD-10-CM | POA: Diagnosis not present

## 2019-09-12 DIAGNOSIS — F17210 Nicotine dependence, cigarettes, uncomplicated: Secondary | ICD-10-CM | POA: Insufficient documentation

## 2019-09-12 DIAGNOSIS — I700 Atherosclerosis of aorta: Secondary | ICD-10-CM | POA: Diagnosis not present

## 2019-09-12 DIAGNOSIS — R10813 Right lower quadrant abdominal tenderness: Secondary | ICD-10-CM | POA: Diagnosis not present

## 2019-09-12 DIAGNOSIS — R1030 Lower abdominal pain, unspecified: Secondary | ICD-10-CM | POA: Diagnosis not present

## 2019-09-12 DIAGNOSIS — N939 Abnormal uterine and vaginal bleeding, unspecified: Secondary | ICD-10-CM | POA: Insufficient documentation

## 2019-09-12 DIAGNOSIS — R1031 Right lower quadrant pain: Secondary | ICD-10-CM | POA: Insufficient documentation

## 2019-09-12 DIAGNOSIS — R110 Nausea: Secondary | ICD-10-CM | POA: Diagnosis not present

## 2019-09-12 LAB — CBC WITH DIFFERENTIAL/PLATELET
Abs Immature Granulocytes: 0.02 10*3/uL (ref 0.00–0.07)
Basophils Absolute: 0 10*3/uL (ref 0.0–0.1)
Basophils Relative: 1 %
Eosinophils Absolute: 0 10*3/uL (ref 0.0–0.5)
Eosinophils Relative: 1 %
HCT: 44.5 % (ref 36.0–46.0)
Hemoglobin: 15.2 g/dL — ABNORMAL HIGH (ref 12.0–15.0)
Immature Granulocytes: 0 %
Lymphocytes Relative: 45 %
Lymphs Abs: 2.7 10*3/uL (ref 0.7–4.0)
MCH: 31.9 pg (ref 26.0–34.0)
MCHC: 34.2 g/dL (ref 30.0–36.0)
MCV: 93.5 fL (ref 80.0–100.0)
Monocytes Absolute: 0.5 10*3/uL (ref 0.1–1.0)
Monocytes Relative: 8 %
Neutro Abs: 2.8 10*3/uL (ref 1.7–7.7)
Neutrophils Relative %: 45 %
Platelets: 254 10*3/uL (ref 150–400)
RBC: 4.76 MIL/uL (ref 3.87–5.11)
RDW: 12.5 % (ref 11.5–15.5)
WBC: 6 10*3/uL (ref 4.0–10.5)
nRBC: 0 % (ref 0.0–0.2)

## 2019-09-12 LAB — COMPREHENSIVE METABOLIC PANEL
ALT: 31 U/L (ref 0–44)
AST: 30 U/L (ref 15–41)
Albumin: 4.5 g/dL (ref 3.5–5.0)
Alkaline Phosphatase: 54 U/L (ref 38–126)
Anion gap: 11 (ref 5–15)
BUN: 9 mg/dL (ref 6–20)
CO2: 25 mmol/L (ref 22–32)
Calcium: 8.9 mg/dL (ref 8.9–10.3)
Chloride: 107 mmol/L (ref 98–111)
Creatinine, Ser: 0.47 mg/dL (ref 0.44–1.00)
GFR calc Af Amer: >60 mL/min (ref >60)
GFR calc non Af Amer: >60 mL/min (ref >60)
Glucose, Bld: 99 mg/dL (ref 70–99)
Potassium: 4 mmol/L (ref 3.5–5.1)
Sodium: 143 mmol/L (ref 135–145)
Total Bilirubin: 0.9 mg/dL (ref 0.3–1.2)
Total Protein: 7.7 g/dL (ref 6.5–8.1)

## 2019-09-12 LAB — URINALYSIS, ROUTINE W REFLEX MICROSCOPIC
Bilirubin Urine: NEGATIVE
Glucose, UA: NEGATIVE mg/dL
Ketones, ur: NEGATIVE mg/dL
Nitrite: NEGATIVE
Protein, ur: NEGATIVE mg/dL
Specific Gravity, Urine: 1.015 (ref 1.005–1.030)
pH: 7 (ref 5.0–8.0)

## 2019-09-12 LAB — PREGNANCY, URINE: Preg Test, Ur: NEGATIVE

## 2019-09-12 MED ORDER — FENTANYL CITRATE (PF) 100 MCG/2ML IJ SOLN
50.0000 ug | Freq: Once | INTRAMUSCULAR | Status: AC
  Administered 2019-09-12: 50 ug via INTRAVENOUS
  Filled 2019-09-12: qty 2

## 2019-09-12 MED ORDER — KETOROLAC TROMETHAMINE 15 MG/ML IJ SOLN
15.0000 mg | Freq: Once | INTRAMUSCULAR | Status: AC
  Administered 2019-09-12: 15 mg via INTRAVENOUS
  Filled 2019-09-12: qty 1

## 2019-09-12 NOTE — ED Triage Notes (Addendum)
Pt reports started having lower abd cramping while driving back from [facility] this morning.   Pt says hurting in r flank.  Pt tearful.  Reports went to PCP and was sent here to rule out appendicitis.  Denies any n/v.  Denies any pain or burning with urination.   Pt says has an IUD and reports had some vaginal bleeding yesterday.

## 2019-09-12 NOTE — ED Provider Notes (Signed)
[facility] EMERGENCY DEPARTMENT Provider Note   CSN: [id number] Arrival date & time: 09/12/19  1222     History Chief Complaint  Patient presents with  . Flank Pain    [name] is a 49 y.o. female who presents with right sided abdominal pain.  Patient states that she had acute onset of right lower quadrant abdominal pain while driving this morning.  Pain was a dull ache at the time but then became severe, sharp and stabbing.  She went to her doctor's office and they wanted to do an MRI of her abdomen to rule out appendicitis however she states that because she was in so much pain she decided to come to the ER.  She reports associated nausea but no vomiting.  She thinks that is related to the pain.  The pain is all in her right flank at this time and she has not no abdominal pain anymore.  She denies diarrhea, constipation, urinary symptoms, vaginal discharge.  She had some vaginal bleeding a couple of days ago which has resolved.  She has an IUD and does not have periods normally.  She denies any prior abdominal surgeries.  HPI     Past Medical History:  Diagnosis Date  . Anxiety     Patient Active Problem List   Diagnosis Date Noted  . Metatarsal bone fracture 08/13/2010    History reviewed. No pertinent surgical history.   OB History   No obstetric history on file.     No family history on file.  Social History   Tobacco Use  . Smoking status: Light Tobacco Smoker    Types: Cigarettes  . Smokeless tobacco: Never Used  Substance Use Topics  . Alcohol use: Yes    Alcohol/week: 2.0 standard drinks    Types: 2 Glasses of wine per week    Comment: occasional  . Drug use: No    Home Medications Prior to Admission medications   Medication Sig Start Date End Date Taking? Authorizing Provider  clonazePAM (KLONOPIN) 0.5 MG tablet Take 0.25-0.5 mg by mouth 2 (two) times daily as needed for anxiety.  04/19/14   [provider]  hydrocodone-ibuprofen  (VICOPROFEN) 5-200 MG tablet Take 1 tablet by mouth every 6 (six) hours as needed for pain. 11/14/15   Brock Bad, MD  levonorgestrel (MIRENA) 20 MCG/24HR IUD 1 each by Intrauterine route once.    [provider]  Multiple Vitamin (MULTIVITAMIN WITH MINERALS) TABS tablet Take 1 tablet by mouth daily.    [provider]  Prenatal Vit-Fe Fumarate-FA (PNV PRENATAL PLUS MULTIVITAMIN) 27-1 MG TABS Take 1 tablet by mouth daily before breakfast. 11/14/15   Brock Bad, MD    Allergies    Patient has no known allergies.  Review of Systems   Review of Systems  Constitutional: Negative for chills and fever.  Respiratory: Negative for shortness of breath.   Cardiovascular: Negative for chest pain.  Gastrointestinal: Positive for abdominal pain and nausea. Negative for constipation, diarrhea and vomiting.  Genitourinary: Positive for flank pain, pelvic pain and vaginal bleeding. Negative for difficulty urinating, dysuria, hematuria, vaginal discharge and vaginal pain.  All other systems reviewed and are negative.   Physical Exam Updated Vital Signs BP (!) 145/101 (BP Location: Right Arm)   Pulse 91   Temp 97.8 F (36.6 C) (Oral)   Resp 20   Ht 5\' 6"  (1.676 m)   Wt 77.1 kg   SpO2 98%   BMI 27.44 kg/m  Physical Exam Vitals and nursing note reviewed.  Constitutional:      General: She is not in acute distress.    Appearance: Normal appearance. She is well-developed. She is not ill-appearing.  HENT:     Head: Normocephalic and atraumatic.  Eyes:     General: No scleral icterus.       Right eye: No discharge.        Left eye: No discharge.     Conjunctiva/sclera: Conjunctivae normal.     Pupils: Pupils are equal, round, and reactive to light.  Cardiovascular:     Rate and Rhythm: Normal rate and regular rhythm.  Pulmonary:     Effort: Pulmonary effort is normal. No respiratory distress.     Breath sounds: Normal breath sounds.  Abdominal:     General:  There is no distension.     Palpations: Abdomen is soft.     Tenderness: There is no abdominal tenderness. There is right CVA tenderness (mild).  Musculoskeletal:     Cervical back: Normal range of motion.  Skin:    General: Skin is warm and dry.  Neurological:     Mental Status: She is alert and oriented to person, place, and time.  Psychiatric:        Mood and Affect: Mood is anxious.        Behavior: Behavior normal.     ED Results / Procedures / Treatments   Labs (all labs ordered are listed, but only abnormal results are displayed) Labs Reviewed  URINALYSIS, ROUTINE W REFLEX MICROSCOPIC - Abnormal; Notable for the following components:      Result Value   Hgb urine dipstick SMALL (*)    Leukocytes,Ua MODERATE (*)    Bacteria, UA RARE (*)    All other components within normal limits  CBC WITH DIFFERENTIAL/PLATELET - Abnormal; Notable for the following components:   Hemoglobin 15.2 (*)    All other components within normal limits  URINE CULTURE  PREGNANCY, URINE  COMPREHENSIVE METABOLIC PANEL    EKG None  Radiology CT Renal Stone Study  Result Date: 09/12/2019 CLINICAL DATA:  Flank pain, kidney stone suspected. No history of renal stones. EXAM: CT ABDOMEN AND PELVIS WITHOUT CONTRAST TECHNIQUE: Multidetector CT imaging of the abdomen and pelvis was performed following the standard protocol without IV contrast. COMPARISON:  None. FINDINGS: Lower chest: Lung bases are clear. Hepatobiliary: Liver is normal in size and contour no pericholecystic stranding. Pancreas: No peripancreatic inflammation.  No ductal dilation. Spleen: Spleen normal size without focal lesion. Adrenals/Urinary Tract: Adrenal glands are normal. Renal contours are smooth. No sign of hydronephrosis. Urinary bladder is normal. No nephrolithiasis.  No hydronephrosis. Stomach/Bowel: Minimal atheromatous plaque of the abdominal aorta. No aneurysmal dilation. No pelvic lymphadenopathy. Reproductive: IUD in situ.  Adnexal structures and uterus otherwise unremarkable with limited assessment on noncontrast imaging with CT. Other: No ascites.  No free air. Musculoskeletal: No acute musculoskeletal process. Spinal degenerative changes. IMPRESSION: 1. No nephrolithiasis or hydronephrosis. 2. Normal appendix. 3. Possible sludge in the gallbladder but without pericholecystic stranding. Electronically Signed   By: Zetta Bills M.D.   On: 09/12/2019 15:19    Procedures Procedures (including critical care time)  Medications Ordered in ED Medications  fentaNYL (SUBLIMAZE) injection 50 mcg (50 mcg Intravenous Given 09/12/19 1450)  ketorolac (TORADOL) 15 MG/ML injection 15 mg (15 mg Intravenous Given 09/12/19 1451)    ED Course  I have reviewed the triage vital signs and the nursing notes.  Pertinent labs & imaging  results that were available during my care of the patient were reviewed by me and considered in my medical decision making (see chart for details).  49 year old female presents with right-sided abdominal pain with acute onset since this morning while driving.  Differential includes renal colic, kidney stone, pyelonephritis, appendicitis, ovarian cyst.  She is mildly hypertensive but otherwise vital signs are normal.  She is nontender in the abdomen.  She has mild tenderness over the right kidney.  I have a higher suspicion for kidney stone rather than appendicitis with acute onset and colicky sounding symptoms.  Her blood work is normal.  UA has small amount of hemoglobin and moderate leukocytes with rare bacteria.  Will obtain CT renal and provide pain control  CT is negative for any kidney stone, hydronephrosis, appendicitis.  There is no adnexal mass or cyst.  Patient was reevaluated she states symptoms are completely resolved.  Question the possibility of a passed kidney stone.  She does have leukocytes and bacteria in the urine we will send off for urine culture although she has no urinary symptoms.  She  is advised to drink plenty of fluids and take ibuprofen as needed for pain.  MDM Rules/Calculators/A&P                           Final Clinical Impression(s) / ED Diagnoses Final diagnoses:  Right flank pain    Rx / DC Orders ED Discharge Orders    None       Bethel Born, PA-C 09/12/19 1830    Eber Hong, MD 09/13/19 1728

## 2019-09-12 NOTE — ED Notes (Signed)
Pt updated that we are doing some standard orders to start her ER process. Pt is states "I am more frustrated then in pain because I have been sitting at my PCP all morning and now I am just sitting here." NT explained to pt that there is only one MD and one PA here at this time that they will be coming in as soon as possible. Lurena Joiner RN notified of conversation.

## 2019-09-12 NOTE — Discharge Instructions (Signed)
Please drink plenty of fluids and take Ibuprofen for mild-moderate pain Return if you are having worsening symptoms

## 2019-09-14 LAB — URINE CULTURE: Culture: <10000 — AB

## 2020-01-17 DIAGNOSIS — F411 Generalized anxiety disorder: Secondary | ICD-10-CM | POA: Diagnosis not present

## 2020-01-17 DIAGNOSIS — Z0000 Encounter for general adult medical examination without abnormal findings: Secondary | ICD-10-CM | POA: Diagnosis not present

## 2020-01-17 DIAGNOSIS — F39 Unspecified mood [affective] disorder: Secondary | ICD-10-CM | POA: Diagnosis not present

## 2020-01-17 DIAGNOSIS — R10813 Right lower quadrant abdominal tenderness: Secondary | ICD-10-CM | POA: Diagnosis not present

## 2020-01-17 DIAGNOSIS — F419 Anxiety disorder, unspecified: Secondary | ICD-10-CM | POA: Diagnosis not present

## 2020-01-24 DIAGNOSIS — Z23 Encounter for immunization: Secondary | ICD-10-CM | POA: Diagnosis not present

## 2020-01-24 DIAGNOSIS — Z0001 Encounter for general adult medical examination with abnormal findings: Secondary | ICD-10-CM | POA: Diagnosis not present

## 2020-01-24 DIAGNOSIS — F17210 Nicotine dependence, cigarettes, uncomplicated: Secondary | ICD-10-CM | POA: Diagnosis not present

## 2020-01-24 DIAGNOSIS — Z716 Tobacco abuse counseling: Secondary | ICD-10-CM | POA: Diagnosis not present

## 2020-01-25 ENCOUNTER — Other Ambulatory Visit (HOSPITAL_COMMUNITY): Payer: Self-pay | Admitting: Internal Medicine

## 2020-01-25 DIAGNOSIS — Z1231 Encounter for screening mammogram for malignant neoplasm of breast: Secondary | ICD-10-CM

## 2020-02-13 ENCOUNTER — Ambulatory Visit (HOSPITAL_COMMUNITY)
Admission: RE | Admit: 2020-02-13 | Discharge: 2020-02-13 | Disposition: A | Discharge status: Home / Self Care | Payer: BC Managed Care – PPO | Source: Ambulatory Visit | Attending: Internal Medicine | Admitting: Internal Medicine

## 2020-02-13 ENCOUNTER — Other Ambulatory Visit: Payer: Self-pay

## 2020-02-13 DIAGNOSIS — Z1231 Encounter for screening mammogram for malignant neoplasm of breast: Secondary | ICD-10-CM | POA: Diagnosis not present

## 2020-12-13 ENCOUNTER — Ambulatory Visit
Admission: EM | Admit: 2020-12-13 | Discharge: 2020-12-13 | Disposition: A | Discharge status: Home / Self Care | Payer: 59 | Attending: Family Medicine | Admitting: Family Medicine

## 2020-12-13 ENCOUNTER — Other Ambulatory Visit: Payer: Self-pay

## 2020-12-13 DIAGNOSIS — J069 Acute upper respiratory infection, unspecified: Secondary | ICD-10-CM | POA: Diagnosis not present

## 2020-12-13 DIAGNOSIS — R062 Wheezing: Secondary | ICD-10-CM

## 2020-12-13 MED ORDER — PREDNISONE 20 MG PO TABS
40.0000 mg | ORAL_TABLET | Freq: Every day | ORAL | 0 refills | Status: AC
Start: 2020-12-13 — End: ?

## 2020-12-13 MED ORDER — HYDROCOD POLST-CPM POLST ER 10-8 MG/5ML PO SUER: 5.0000 mL | Freq: Two times a day (BID) | ORAL | 0 refills | Status: AC | PRN

## 2020-12-13 NOTE — Discharge Instructions (Signed)
Be aware, your cough medication may cause drowsiness. Please do not drive, operate heavy machinery or make important decisions while on this medication, it can cloud your judgement.  

## 2020-12-13 NOTE — ED Provider Notes (Signed)
  [facility]   [id number] 12/13/20 Arrival Time: 0913  ASSESSMENT & PLAN:  1. Viral URI with cough   2. Wheezing    Discussed typical duration of viral illnesses. COVID-19 testing declined. OTC symptom care as needed. Begin: Meds ordered this encounter  Medications   predniSONE (DELTASONE) 20 MG tablet    Sig: Take 2 tablets (40 mg total) by mouth daily.    Dispense:  10 tablet    Refill:  0   chlorpheniramine-HYDROcodone (TUSSIONEX PENNKINETIC ER) 10-8 MG/5ML SUER    Sig: Take 5 mLs by mouth every 12 (twelve) hours as needed for cough.    Dispense:  90 mL    Refill:  0     Follow-up Information     [facility] [place].   Specialty: Urgent Care Why: If worsening or failing to improve as anticipated. Contact information: [address] [place] [phone] [phone]                Reviewed expectations re: course of current medical issues. Questions answered. Outlined signs and symptoms indicating need for more acute intervention. Understanding verbalized. After Visit Summary given.   SUBJECTIVE: History from: patient. [name] is a 50 y.o. female who reports cough, congestion, HA, body aches; abrupt onset 3 d ago; husband with same. Denies: fever. Normal PO intake without n/v/d.  OBJECTIVE:  Vitals:   12/13/20 0953  BP: (!) 126/93  Pulse: 84  Resp: 18  Temp: 98.6 F (37 C)  TempSrc: Oral  SpO2: 92%    General appearance: alert; no distress Eyes: PERRLA; EOMI; conjunctiva normal HENT: [place]; AT; with nasal congestion Neck: supple  Lungs: speaks full sentences without difficulty; unlabored; bilat exp wheezing Extremities: no edema Skin: warm and dry Neurologic: normal gait Psychological: alert and cooperative; normal mood and affect   No Known Allergies  Past Medical History:  Diagnosis Date   Anxiety    Social History   Socioeconomic History   Marital status: Married     Spouse name: Not on file   Number of children: Not on file   Years of education: bachelors    Highest education level: Not on file  Occupational History   Occupation: catalog wardrobe stylist  Tobacco Use   Smoking status: Light Smoker    Types: Cigarettes   Smokeless tobacco: Never  Substance and Sexual Activity   Alcohol use: Yes    Alcohol/week: 2.0 standard drinks    Types: 2 Glasses of wine per week    Comment: occasional   Drug use: No   Sexual activity: Yes    Partners: Male    Birth control/protection: I.U.D.  Other Topics Concern   Not on file  Social History Narrative   Not on file   Social Determinants of Health   Financial Resource Strain: Not on file  Food Insecurity: Not on file  Transportation Needs: Not on file  Physical Activity: Not on file  Stress: Not on file  Social Connections: Not on file  Intimate Partner Violence: Not on file   History reviewed. No pertinent family history. History reviewed. No pertinent surgical history.   Mardella Layman, MD 12/13/20 1019

## 2020-12-13 NOTE — ED Triage Notes (Signed)
3 day h/o cough, congestion, watery eyes, HA and body aches.  Has been taking mucinex and theraflu with some relief. Pt is covid vaccinated, not boosted.

## 2021-07-14 IMAGING — MG DIGITAL SCREENING BILAT W/ TOMO W/ CAD
8 series · 8 of 24 positions shown · non-contrast
Comparison: Previous exam(s).

CLINICAL DATA: Screening.

EXAM:
DIGITAL SCREENING BILATERAL MAMMOGRAM WITH TOMO AND CAD

[L MLO synth-2D]
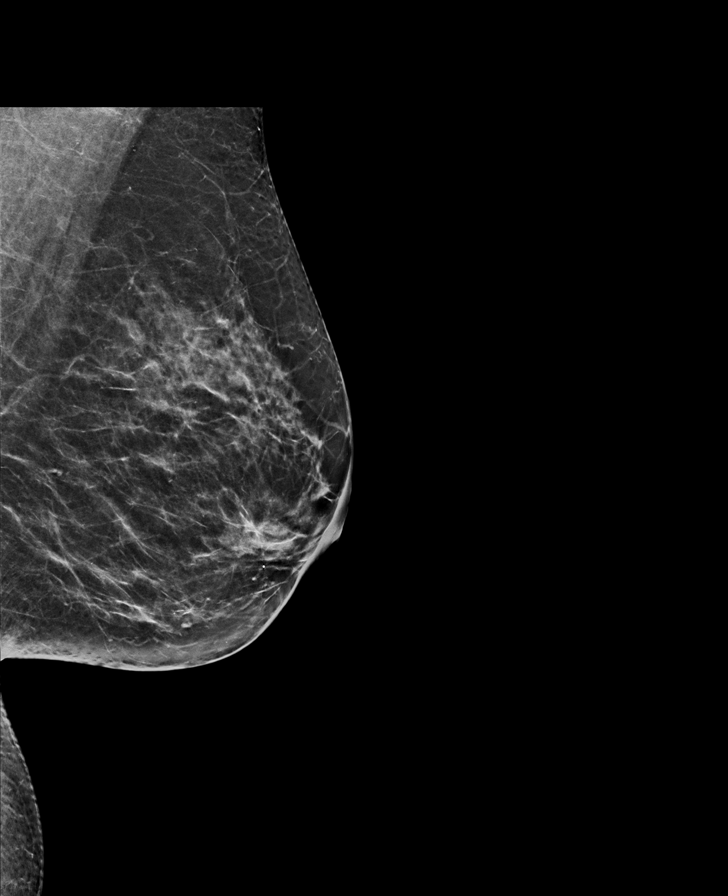

[R CC synth-2D]
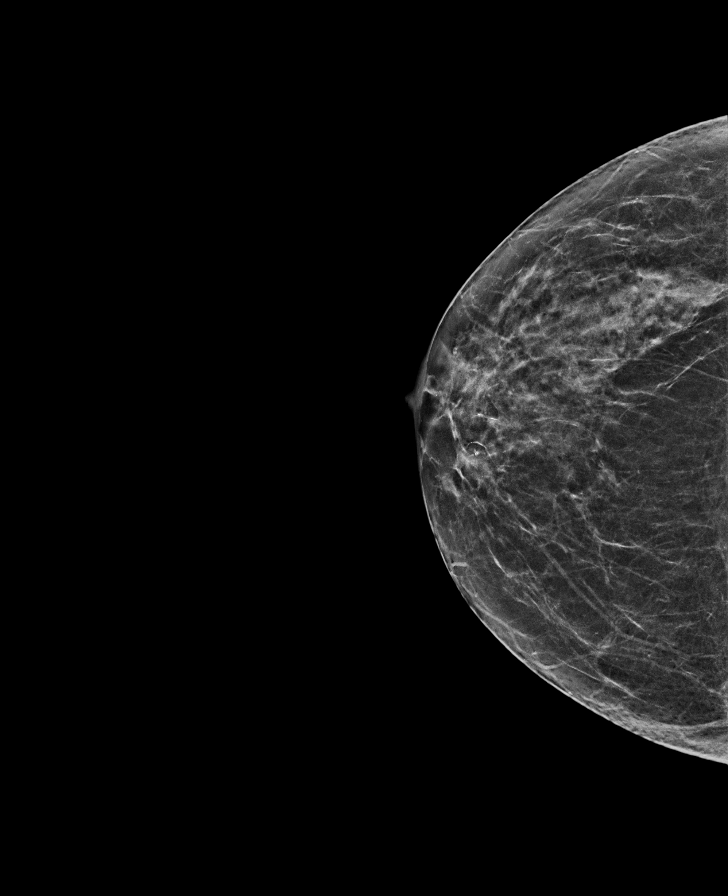

[L CC synth-2D]
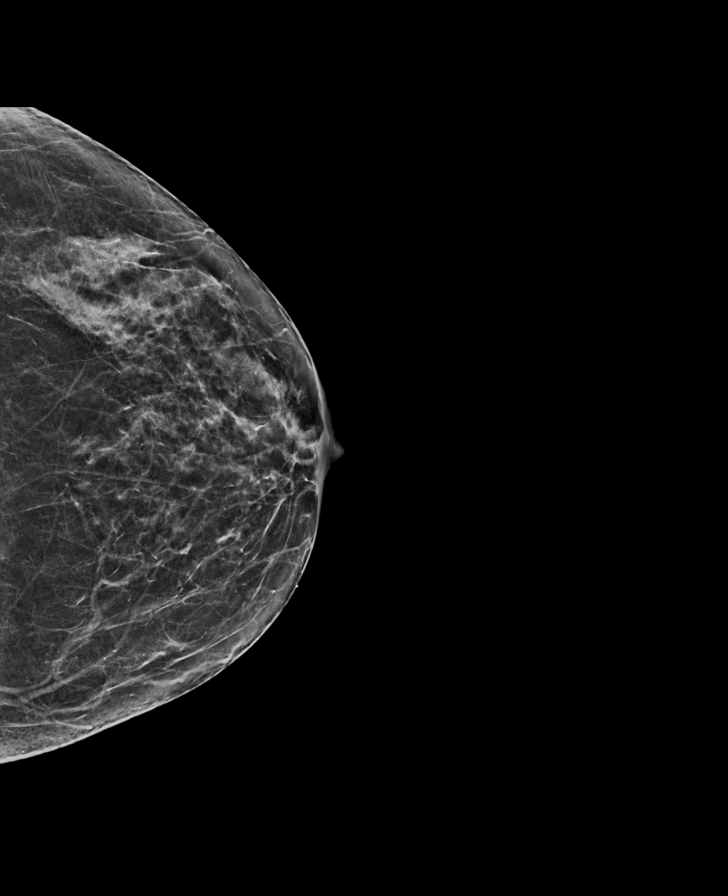

[R MLO synth-2D]
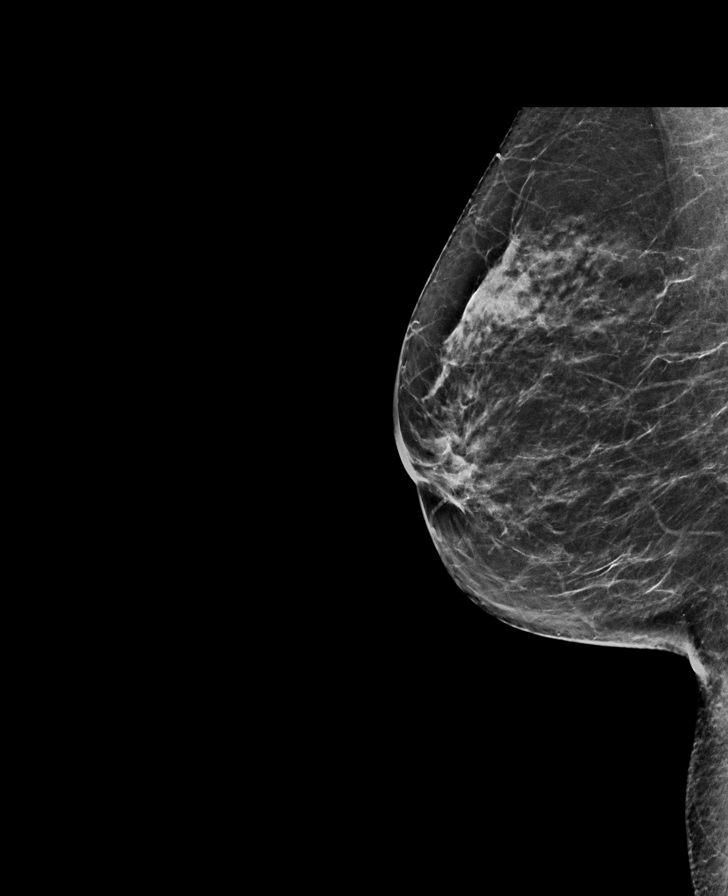

[R MLO tomo · tomo slice 31/60.0]
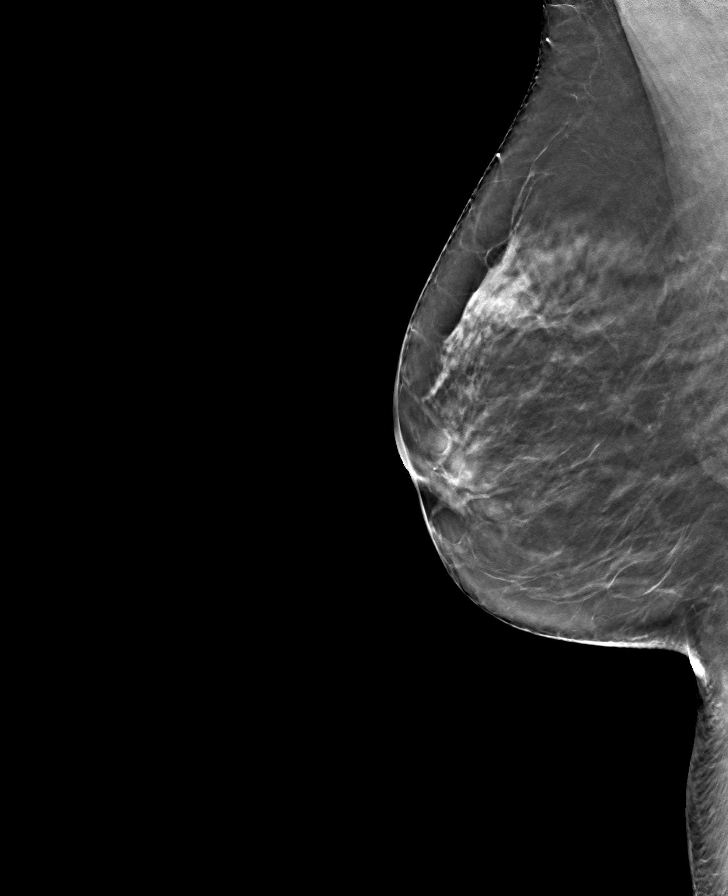

[L CC tomo · tomo slice 31/60.0]
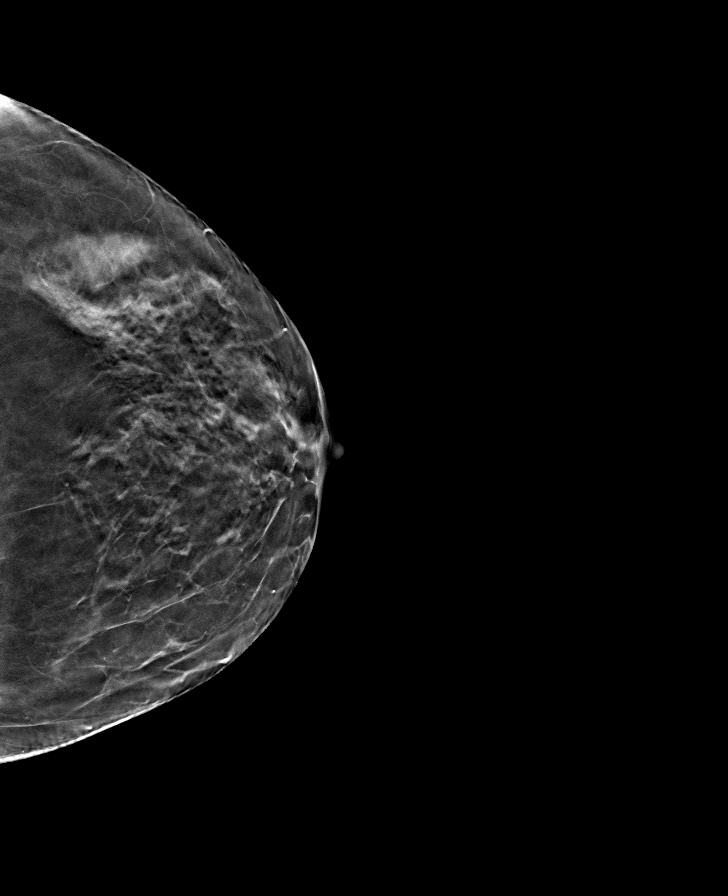

[R CC tomo · tomo slice 29/56.0]
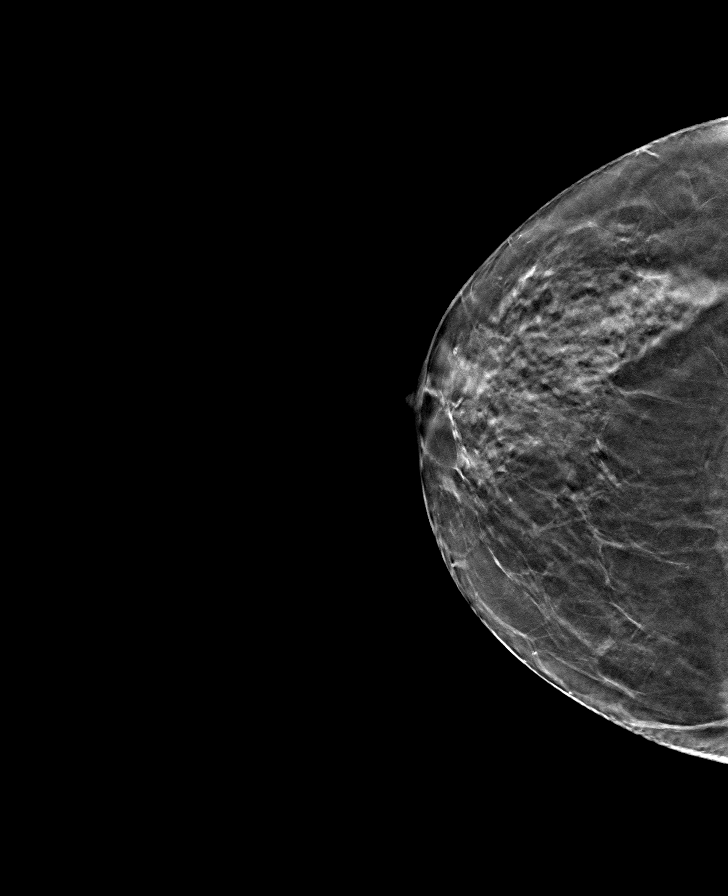

[L MLO tomo · tomo slice 33/64.0]
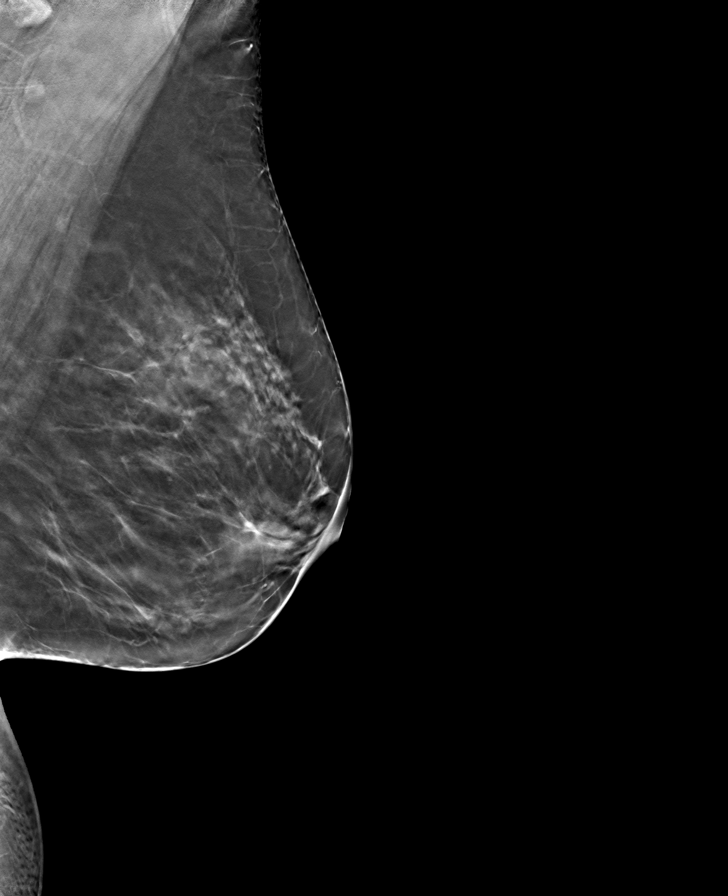

[8 of 24 positions shown; findings below may reference images not displayed]

ACR Breast Density Category c: The breast tissue is heterogeneously
dense, which may obscure small masses.
FINDINGS: There are no findings suspicious for malignancy. Images were
processed with CAD.
IMPRESSION: No mammographic evidence of malignancy. A result letter of this
screening mammogram will be mailed directly to the patient.

RECOMMENDATION:
Screening mammogram in one year. (Code:[id number]-[id number])

BI-RADS CATEGORY  1: Negative.

## 2021-12-10 ENCOUNTER — Other Ambulatory Visit (HOSPITAL_COMMUNITY): Payer: Self-pay | Admitting: Adult Health Nurse Practitioner

## 2021-12-10 DIAGNOSIS — Z0000 Encounter for general adult medical examination without abnormal findings: Secondary | ICD-10-CM | POA: Diagnosis not present

## 2021-12-10 DIAGNOSIS — Z1231 Encounter for screening mammogram for malignant neoplasm of breast: Secondary | ICD-10-CM

## 2021-12-12 ENCOUNTER — Ambulatory Visit (HOSPITAL_COMMUNITY): Payer: 59

## 2021-12-30 ENCOUNTER — Ambulatory Visit (HOSPITAL_COMMUNITY)
Admission: RE | Admit: 2021-12-30 | Discharge: 2021-12-30 | Disposition: A | Discharge status: Home / Self Care | Payer: BC Managed Care – PPO | Source: Ambulatory Visit | Attending: Adult Health Nurse Practitioner | Admitting: Adult Health Nurse Practitioner

## 2021-12-30 DIAGNOSIS — Z1231 Encounter for screening mammogram for malignant neoplasm of breast: Secondary | ICD-10-CM | POA: Diagnosis present

## 2022-03-31 DIAGNOSIS — R509 Fever, unspecified: Secondary | ICD-10-CM | POA: Diagnosis not present

## 2022-03-31 DIAGNOSIS — J069 Acute upper respiratory infection, unspecified: Secondary | ICD-10-CM | POA: Diagnosis not present

## 2022-03-31 DIAGNOSIS — R6883 Chills (without fever): Secondary | ICD-10-CM | POA: Diagnosis not present

## 2022-03-31 DIAGNOSIS — Z6826 Body mass index (BMI) 26.0-26.9, adult: Secondary | ICD-10-CM | POA: Diagnosis not present

## 2022-03-31 DIAGNOSIS — J029 Acute pharyngitis, unspecified: Secondary | ICD-10-CM | POA: Diagnosis not present

## 2022-07-31 DIAGNOSIS — R030 Elevated blood-pressure reading, without diagnosis of hypertension: Secondary | ICD-10-CM | POA: Diagnosis not present

## 2022-07-31 DIAGNOSIS — Z6827 Body mass index (BMI) 27.0-27.9, adult: Secondary | ICD-10-CM | POA: Diagnosis not present

## 2022-07-31 DIAGNOSIS — F411 Generalized anxiety disorder: Secondary | ICD-10-CM | POA: Diagnosis not present

## 2023-01-20 DIAGNOSIS — Z0000 Encounter for general adult medical examination without abnormal findings: Secondary | ICD-10-CM | POA: Diagnosis not present

## 2023-01-21 ENCOUNTER — Other Ambulatory Visit (HOSPITAL_COMMUNITY): Payer: Self-pay | Admitting: Family Medicine

## 2023-01-21 DIAGNOSIS — Z1231 Encounter for screening mammogram for malignant neoplasm of breast: Secondary | ICD-10-CM

## 2023-01-22 ENCOUNTER — Encounter (INDEPENDENT_AMBULATORY_CARE_PROVIDER_SITE_OTHER): Payer: Self-pay | Admitting: *Deleted

## 2023-02-04 ENCOUNTER — Ambulatory Visit (HOSPITAL_COMMUNITY)
Admission: RE | Admit: 2023-02-04 | Discharge: 2023-02-04 | Disposition: A | Discharge status: Home / Self Care | Payer: 59 | Source: Ambulatory Visit | Attending: Family Medicine | Admitting: Family Medicine

## 2023-02-04 DIAGNOSIS — Z1231 Encounter for screening mammogram for malignant neoplasm of breast: Secondary | ICD-10-CM | POA: Diagnosis not present

## 2023-02-17 DIAGNOSIS — Z6827 Body mass index (BMI) 27.0-27.9, adult: Secondary | ICD-10-CM | POA: Diagnosis not present

## 2023-02-17 DIAGNOSIS — K649 Unspecified hemorrhoids: Secondary | ICD-10-CM | POA: Diagnosis not present

## 2023-02-26 DIAGNOSIS — Z6828 Body mass index (BMI) 28.0-28.9, adult: Secondary | ICD-10-CM | POA: Diagnosis not present

## 2023-02-26 DIAGNOSIS — R509 Fever, unspecified: Secondary | ICD-10-CM | POA: Diagnosis not present

## 2023-02-26 DIAGNOSIS — R0989 Other specified symptoms and signs involving the circulatory and respiratory systems: Secondary | ICD-10-CM | POA: Diagnosis not present

## 2023-04-09 ENCOUNTER — Ambulatory Visit (INDEPENDENT_AMBULATORY_CARE_PROVIDER_SITE_OTHER): Payer: 59 | Admitting: Adult Health

## 2023-04-09 ENCOUNTER — Encounter: Payer: Self-pay | Admitting: Adult Health

## 2023-04-09 VITALS — BP 141/91 | HR 88 | Ht 66.0 in | Wt 169.0 lb

## 2023-04-09 DIAGNOSIS — R0683 Snoring: Secondary | ICD-10-CM | POA: Diagnosis not present

## 2023-04-09 DIAGNOSIS — G4733 Obstructive sleep apnea (adult) (pediatric): Secondary | ICD-10-CM | POA: Diagnosis not present

## 2023-04-09 NOTE — Patient Instructions (Signed)
Set up for home sleep study  Work on healthy weight loss  Do not drive if sleepy   Work on not smoking   Follow up in 6 weeks to discuss sleep study results and treatment plan

## 2023-04-09 NOTE — Assessment & Plan Note (Signed)
Snoring, daytime sleepiness, restless sleep, previous diagnosis of sleep apnea all suspicious for ongoing sleep apnea.  Patient education given on sleep apnea.  Will set up for home sleep study.  Healthy sleep regimen discussed in detail.  - discussed how weight can impact sleep and risk for sleep disordered breathing - discussed options to assist with weight loss: combination of diet modification, cardiovascular and strength training exercises   - had an extensive discussion regarding the adverse health consequences related to untreated sleep disordered breathing - specifically discussed the risks for hypertension, coronary artery disease, cardiac dysrhythmias, cerebrovascular disease, and diabetes - lifestyle modification discussed   - discussed how sleep disruption can increase risk of accidents, particularly when driving - safe driving practices were discussed   Plan  Patient Instructions  Set up for home sleep study  Work on healthy weight loss  Do not drive if sleepy   Work on not smoking   Follow up in 6 weeks to discuss sleep study results and treatment plan

## 2023-04-09 NOTE — Progress Notes (Signed)
@Patient  ID: [name], female    DOB: 1970/08/20, 53 y.o.   MRN: [id number]  Chief Complaint  Patient presents with   Consult    Discussed the use of AI scribe software for clinical note transcription with the patient, who gave verbal consent to proceed.  Referring provider: Inez Pilgrim, NP  HPI: 53 year old female seen for sleep consult April 09, 2023 for snoring, restless sleep and daytime sleepiness. Previously diagnosed with sleep apnea was on CPAP for short time.    TEST/EVENTS :   04/09/2023 Sleep consult  Patient presents for a sleep consult.  Patient complains of longstanding snoring, feeling tired all the time, restless sleep and daytime sleepiness.  She says that she was diagnosed with sleep apnea years ago.  Was on CPAP for a while but stopped using it.  She continues to have ongoing symptom burden.  Typically goes to bed about 930 to 10:30 PM.  Takes up to 30 minutes to go to sleep.  Is up a couple times throughout the night.  Gets up at 6:30 AM.  Weight is up 10 pounds over the last 2 years.  Current weight is at 169 pounds with a BMI at 27.  She does not use any sleep aids.  She does not nap.  No removable dental work.  No history of congestive heart failure or stroke.  Gets about 3 cups of caffeine and daily.  No symptoms suspicious for cataplexy or sleep paralysis. They have a history of anxiety, for which they take Klonopin, particularly during high-stress situations and at night to help quiet their mind.  The patient has a smoking history, starting in their twenties, but has reduced their intake to two to three cigarettes per day, not daily. They also report consuming two to three glasses of wine daily. They deny any drug use. They also report some sleepiness after lunch, which they manage with a cup of coffee. They express concern about the potential long-term effects of sleep apnea, including the risk of developing diabetes and coronary artery disease. They  also report some days of feeling like they are "walking around in a fog," but other days they feel great.  Medical history significant for anxiety  Surgical history none  Social history patient is married.  Has 1 child.  Lives at home with her husband and daughter.  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  Smokes 2 to 3 cigarettes a day.  Drinks about 2 to 3 glasses of wine daily.  No drug use.     No Known Allergies  Immunization History  Administered Date(s) Administered   Moderna Sars-Covid-2 Vaccination 05/15/2019, 06/18/2019   Tdap 11/05/2013    Past Medical History:  Diagnosis Date   Anxiety     Tobacco History: Social History   Tobacco Use  Smoking Status Light Smoker   Types: Cigarettes  Smokeless Tobacco Never   Ready to quit: Not Answered Counseling given: Not Answered   Outpatient Medications Prior to Visit  Medication Sig Dispense Refill   clonazePAM (KLONOPIN) 0.5 MG tablet Take 0.25-0.5 mg by mouth 2 (two) times daily as needed for anxiety.   1   levonorgestrel (MIRENA) 20 MCG/24HR IUD 1 each by Intrauterine route once.     Multiple Vitamin (MULTIVITAMIN WITH MINERALS) TABS tablet Take 1 tablet by mouth daily.     chlorpheniramine-HYDROcodone (TUSSIONEX PENNKINETIC ER) 10-8 MG/5ML SUER Take 5 mLs by mouth every 12 (twelve) hours as needed for cough. (Patient not taking: Reported on  04/09/2023) 90 mL 0   predniSONE (DELTASONE) 20 MG tablet Take 2 tablets (40 mg total) by mouth daily. (Patient not taking: Reported on 04/09/2023) 10 tablet 0   propranolol (INDERAL) 20 MG tablet Take 20 mg by mouth at bedtime. (Patient not taking: Reported on 04/09/2023)     No facility-administered medications prior to visit.     Review of Systems:   Constitutional:   No  weight loss, night sweats,  Fevers, chills, +fatigue, or  lassitude.  HEENT:   No headaches,  Difficulty swallowing,  Tooth/dental problems, or  Sore throat,                No sneezing, itching, ear ache,  nasal congestion, post nasal drip,   CV:  No chest pain,  Orthopnea, PND, swelling in lower extremities, anasarca, dizziness, palpitations, syncope.   GI  No heartburn, indigestion, abdominal pain, nausea, vomiting, diarrhea, change in bowel habits, loss of appetite, bloody stools.   Resp:   No chest wall deformity  Skin: no rash or lesions.  GU: no dysuria, change in color of urine, no urgency or frequency.  No flank pain, no hematuria   MS:  No joint pain or swelling.  No decreased range of motion.  No back pain.    Physical Exam  BP (!) 141/91   Pulse 88   Ht 5\' 6"  (1.676 m)   Wt 169 lb (76.7 kg)   SpO2 96% Comment: room air  BMI 27.28 kg/m   GEN: A/Ox3; pleasant , NAD, well nourished    HEENT:  /AT,   NOSE-clear, THROAT-clear, no lesions, no postnasal drip or exudate noted.  Class III MP airway  NECK:  Supple w/ fair ROM; no JVD; normal carotid impulses w/o bruits; no thyromegaly or nodules palpated; no lymphadenopathy.    RESP  Clear  P & A; w/o, wheezes/ rales/ or rhonchi. no accessory muscle use, no dullness to percussion  CARD:  RRR, no m/r/g, no peripheral edema, pulses intact, no cyanosis or clubbing.  GI:   Soft & nt; nml bowel sounds; no organomegaly or masses detected.   Musco: Warm bil, no deformities or joint swelling noted.   Neuro: alert, no focal deficits noted.    Skin: Warm, no lesions or rashes    Lab Results:   BNP No results found for: "BNP"  ProBNP No results found for: "PROBNP"  Imaging: No results found.  Administration History     None           No data to display          No results found for: "NITRICOXIDE"      Assessment & Plan:   Snoring Snoring, daytime sleepiness, restless sleep, previous diagnosis of sleep apnea all suspicious for ongoing sleep apnea.  Patient education given on sleep apnea.  Will set up for home sleep study.  Healthy sleep regimen discussed in detail.  - discussed how weight can  impact sleep and risk for sleep disordered breathing - discussed options to assist with weight loss: combination of diet modification, cardiovascular and strength training exercises   - had an extensive discussion regarding the adverse health consequences related to untreated sleep disordered breathing - specifically discussed the risks for hypertension, coronary artery disease, cardiac dysrhythmias, cerebrovascular disease, and diabetes - lifestyle modification discussed   - discussed how sleep disruption can increase risk of accidents, particularly when driving - safe driving practices were discussed   Plan  Patient Instructions  Set up  for home sleep study  Work on healthy weight loss  Do not drive if sleepy   Work on not smoking   Follow up in 6 weeks to discuss sleep study results and treatment plan        Rubye Oaks, NP 04/09/2023

## 2023-04-13 DIAGNOSIS — G4733 Obstructive sleep apnea (adult) (pediatric): Secondary | ICD-10-CM

## 2023-05-06 ENCOUNTER — Telehealth: Payer: Self-pay | Admitting: Pulmonary Disease

## 2023-05-06 NOTE — Telephone Encounter (Signed)
 Call patient  Sleep study result  Date of study: 04/13/2023  Impression: Severe obstructive sleep apnea with AHI of 40.5, associated severe oxygen desaturations  Recommendation: DME referral  Recommend CPAP therapy for severe obstructive sleep apnea  Auto titrating CPAP with pressure settings of 5-20 will be appropriate  Encourage weight loss measures  Follow-up in the office 4 to 6 weeks following initiation of treatment

## 2023-05-08 ENCOUNTER — Telehealth (INDEPENDENT_AMBULATORY_CARE_PROVIDER_SITE_OTHER): Payer: 59 | Admitting: Adult Health

## 2023-05-08 ENCOUNTER — Encounter: Payer: Self-pay | Admitting: Adult Health

## 2023-05-08 DIAGNOSIS — G4733 Obstructive sleep apnea (adult) (pediatric): Secondary | ICD-10-CM | POA: Diagnosis not present

## 2023-05-08 NOTE — Progress Notes (Signed)
 Virtual Visit via Video Note  I connected with [name] on 05/08/23 at  3:00 PM EST by a video enabled telemedicine application and verified that I am speaking with the correct person using two identifiers.  Location: Patient: Home  Provider: Office    I discussed the limitations of evaluation and management by telemedicine and the availability of in person appointments. The patient expressed understanding and agreed to proceed.  History of Present Illness: 53 year old female seen for sleep consult April 09, 2023 for snoring and daytime sleepiness found to have severe obstructive sleep apnea Previously diagnosed with sleep apnea on CPAP for short period of time  Today's video visit is a 1 month follow-up to review sleep study results.  Patient was seen last visit for sleep consult for snoring and daytime sleepiness.  She had previously been diagnosed sleep apnea in the past. She did wear CPAP briefly but lost in moving.   She was set up for a repeat sleep study that was done on April 13, 2023 that showed severe sleep apnea with AHI of 40.5/hour and SpO2 low at 70%.  We discussed her sleep study results in detail went over treatment options patient would like to proceed with CPAP therapy   Observations/Objective: Home sleep study 04/13/23 showed severe sleep apnea with AHI at 40.5/hour and SpO2 low at 70%  Assessment and Plan: Severe obstructive sleep apnea.  Recommend beginning auto CPAP 5 to 15 cm H2O. Patient education given on sleep apnea  - discussed how weight can impact sleep and risk for sleep disordered breathing - discussed options to assist with weight loss: combination of diet modification, cardiovascular and strength training exercises   - had an extensive discussion regarding the adverse health consequences related to untreated sleep disordered breathing - specifically discussed the risks for hypertension, coronary artery disease, cardiac dysrhythmias,  cerebrovascular disease, and diabetes - lifestyle modification discussed   - discussed how sleep disruption can increase risk of accidents, particularly when driving - safe driving practices were discussed     Follow Up Instructions:    I discussed the assessment and treatment plan with the patient. The patient was provided an opportunity to ask questions and all were answered. The patient agreed with the plan and demonstrated an understanding of the instructions.   The patient was advised to call back or seek an in-person evaluation if the symptoms worsen or if the condition fails to improve as anticipated.  I provided 21  minutes of non-face-to-face time during this encounter.   Rubye Oaks, NP

## 2023-05-08 NOTE — Patient Instructions (Signed)
 Begin CPAP at bedtime, goal is to wear all night long for at least 6 or more hours Work on healthy weight loss  Do not drive if sleepy  Follow-up in 3 months and as needed

## 2023-05-08 NOTE — Telephone Encounter (Signed)
 Called and spoke with patient, she has a video visit scheduled with Rubye Oaks NP today at 3 pm.  Advised she would review the sleep study results in detail and go over recommendations. She verbalized understanding.  Nothing further needed.

## 2023-06-24 DIAGNOSIS — G4733 Obstructive sleep apnea (adult) (pediatric): Secondary | ICD-10-CM | POA: Diagnosis not present

## 2023-07-20 DIAGNOSIS — G4733 Obstructive sleep apnea (adult) (pediatric): Secondary | ICD-10-CM | POA: Diagnosis not present

## 2023-07-22 DIAGNOSIS — F419 Anxiety disorder, unspecified: Secondary | ICD-10-CM | POA: Diagnosis not present

## 2023-07-23 ENCOUNTER — Encounter (INDEPENDENT_AMBULATORY_CARE_PROVIDER_SITE_OTHER): Payer: Self-pay | Admitting: *Deleted

## 2023-07-24 DIAGNOSIS — G4733 Obstructive sleep apnea (adult) (pediatric): Secondary | ICD-10-CM | POA: Diagnosis not present

## 2023-08-24 DIAGNOSIS — G4733 Obstructive sleep apnea (adult) (pediatric): Secondary | ICD-10-CM | POA: Diagnosis not present

## 2023-09-23 ENCOUNTER — Telehealth: Payer: Self-pay

## 2023-09-23 DIAGNOSIS — G4733 Obstructive sleep apnea (adult) (pediatric): Secondary | ICD-10-CM

## 2023-09-23 NOTE — Telephone Encounter (Signed)
 Copied from CRM [id number]. Topic: General - Other >> Sep 23, 2023  1:38 PM [place] S wrote: Reason for CRM: patient needs a new order rewritten for her cpap machine because during her 90 day period she got covid and its through [facility].

## 2023-11-02 DIAGNOSIS — S96912A Strain of unspecified muscle and tendon at ankle and foot level, left foot, initial encounter: Secondary | ICD-10-CM | POA: Diagnosis not present

## 2024-01-19 ENCOUNTER — Other Ambulatory Visit (HOSPITAL_COMMUNITY): Payer: Self-pay | Admitting: Family Medicine

## 2024-01-19 DIAGNOSIS — Z1231 Encounter for screening mammogram for malignant neoplasm of breast: Secondary | ICD-10-CM

## 2024-01-27 DIAGNOSIS — Z0000 Encounter for general adult medical examination without abnormal findings: Secondary | ICD-10-CM | POA: Diagnosis not present

## 2024-02-08 ENCOUNTER — Ambulatory Visit (HOSPITAL_COMMUNITY)
Admission: RE | Admit: 2024-02-08 | Discharge: 2024-02-08 | Disposition: A | Discharge status: Home / Self Care | Source: Ambulatory Visit | Attending: Family Medicine | Admitting: Family Medicine

## 2024-02-08 ENCOUNTER — Encounter (HOSPITAL_COMMUNITY): Payer: Self-pay

## 2024-02-08 DIAGNOSIS — Z1231 Encounter for screening mammogram for malignant neoplasm of breast: Secondary | ICD-10-CM | POA: Diagnosis not present
